# Patient Record
Sex: Male | Born: 1935 | Race: White | Hispanic: No | Smoking: Former smoker
Health system: Southern US, Community
[De-identification: ages and names within clinical notes are randomized; demographics above are authoritative.]

## PROBLEM LIST (undated history)

## (undated) DIAGNOSIS — D62 Acute posthemorrhagic anemia: Secondary | ICD-10-CM

## (undated) DIAGNOSIS — C189 Malignant neoplasm of colon, unspecified: Secondary | ICD-10-CM

## (undated) DIAGNOSIS — J449 Chronic obstructive pulmonary disease, unspecified: Secondary | ICD-10-CM

## (undated) DIAGNOSIS — Z951 Presence of aortocoronary bypass graft: Secondary | ICD-10-CM

## (undated) DIAGNOSIS — K219 Gastro-esophageal reflux disease without esophagitis: Secondary | ICD-10-CM

## (undated) DIAGNOSIS — C679 Malignant neoplasm of bladder, unspecified: Secondary | ICD-10-CM

## (undated) DIAGNOSIS — N289 Disorder of kidney and ureter, unspecified: Secondary | ICD-10-CM

## (undated) DIAGNOSIS — I639 Cerebral infarction, unspecified: Secondary | ICD-10-CM

## (undated) DIAGNOSIS — R0609 Other forms of dyspnea: Secondary | ICD-10-CM

## (undated) DIAGNOSIS — I2510 Atherosclerotic heart disease of native coronary artery without angina pectoris: Secondary | ICD-10-CM

## (undated) DIAGNOSIS — Z95820 Peripheral vascular angioplasty status with implants and grafts: Secondary | ICD-10-CM

## (undated) DIAGNOSIS — E7800 Pure hypercholesterolemia, unspecified: Secondary | ICD-10-CM

## (undated) DIAGNOSIS — N400 Enlarged prostate without lower urinary tract symptoms: Secondary | ICD-10-CM

## (undated) DIAGNOSIS — I10 Essential (primary) hypertension: Secondary | ICD-10-CM

## (undated) DIAGNOSIS — I739 Peripheral vascular disease, unspecified: Secondary | ICD-10-CM

## (undated) DIAGNOSIS — R0600 Dyspnea, unspecified: Secondary | ICD-10-CM

## (undated) DIAGNOSIS — S301XXA Contusion of abdominal wall, initial encounter: Secondary | ICD-10-CM

## (undated) HISTORY — DX: Gastro-esophageal reflux disease without esophagitis: K21.9

## (undated) HISTORY — DX: Peripheral vascular disease, unspecified: I73.9

## (undated) HISTORY — PX: CHOLECYSTECTOMY OPEN: SUR202

## (undated) HISTORY — DX: Chronic obstructive pulmonary disease, unspecified: J44.9

## (undated) HISTORY — DX: Atherosclerotic heart disease of native coronary artery without angina pectoris: I25.10

## (undated) HISTORY — PX: HERNIA REPAIR: SHX51

## (undated) HISTORY — DX: Presence of aortocoronary bypass graft: Z95.1

## (undated) HISTORY — DX: Benign prostatic hyperplasia without lower urinary tract symptoms: N40.0

## (undated) HISTORY — PX: EYE SURGERY: SHX253

## (undated) HISTORY — PX: FEMORAL-FEMORAL BYPASS GRAFT: SHX936

## (undated) HISTORY — PX: COLECTOMY: SHX59

---

## 1999-01-22 DIAGNOSIS — I639 Cerebral infarction, unspecified: Secondary | ICD-10-CM

## 1999-01-22 HISTORY — DX: Cerebral infarction, unspecified: I63.9

## 2003-07-04 ENCOUNTER — Encounter: Admission: RE | Admit: 2003-07-04 | Discharge: 2003-07-04 | Payer: Self-pay | Admitting: Family Medicine

## 2003-07-04 ENCOUNTER — Encounter: Payer: Self-pay | Admitting: Family Medicine

## 2003-07-11 ENCOUNTER — Ambulatory Visit (HOSPITAL_COMMUNITY): Admission: RE | Admit: 2003-07-11 | Discharge: 2003-07-11 | Payer: Self-pay | Admitting: Gastroenterology

## 2004-12-22 DIAGNOSIS — Z951 Presence of aortocoronary bypass graft: Secondary | ICD-10-CM

## 2004-12-22 HISTORY — DX: Presence of aortocoronary bypass graft: Z95.1

## 2009-02-16 ENCOUNTER — Emergency Department (HOSPITAL_COMMUNITY): Admission: EM | Admit: 2009-02-16 | Discharge: 2009-02-16 | Payer: Self-pay | Admitting: Family Medicine

## 2009-08-02 ENCOUNTER — Ambulatory Visit: Payer: Self-pay | Admitting: Family Medicine

## 2009-09-05 ENCOUNTER — Ambulatory Visit: Payer: Self-pay | Admitting: Family Medicine

## 2009-09-07 ENCOUNTER — Ambulatory Visit: Payer: Self-pay | Admitting: Oncology

## 2009-09-26 LAB — CBC WITH DIFFERENTIAL/PLATELET
BASO%: 1.4 % (ref 0.0–2.0)
EOS%: 4.2 % (ref 0.0–7.0)
Eosinophils Absolute: 0.2 10*3/uL (ref 0.0–0.5)
LYMPH%: 29.2 % (ref 14.0–49.0)
MCHC: 34.4 g/dL (ref 32.0–36.0)
MCV: 92.1 fL (ref 79.3–98.0)
MONO%: 9.1 % (ref 0.0–14.0)
NEUT#: 3.2 10*3/uL (ref 1.5–6.5)
Platelets: 165 10*3/uL (ref 140–400)
RBC: 4.33 10*6/uL (ref 4.20–5.82)
RDW: 13 % (ref 11.0–14.6)

## 2009-09-26 LAB — COMPREHENSIVE METABOLIC PANEL
ALT: 10 U/L (ref 0–53)
AST: 16 U/L (ref 0–37)
Albumin: 4.1 g/dL (ref 3.5–5.2)
Alkaline Phosphatase: 97 U/L (ref 39–117)
Potassium: 4.2 mEq/L (ref 3.5–5.3)
Sodium: 140 mEq/L (ref 135–145)
Total Bilirubin: 0.6 mg/dL (ref 0.3–1.2)
Total Protein: 6.8 g/dL (ref 6.0–8.3)

## 2009-10-09 ENCOUNTER — Ambulatory Visit: Payer: Self-pay | Admitting: Family Medicine

## 2009-10-11 ENCOUNTER — Ambulatory Visit: Payer: Self-pay | Admitting: Family Medicine

## 2010-01-07 ENCOUNTER — Ambulatory Visit: Payer: Self-pay | Admitting: Family Medicine

## 2010-03-25 ENCOUNTER — Ambulatory Visit: Payer: Self-pay | Admitting: Oncology

## 2010-06-18 ENCOUNTER — Ambulatory Visit: Payer: Self-pay | Admitting: Family Medicine

## 2010-08-01 ENCOUNTER — Ambulatory Visit: Payer: Self-pay | Admitting: Family Medicine

## 2010-11-26 ENCOUNTER — Ambulatory Visit: Payer: Self-pay | Admitting: Family Medicine

## 2011-01-12 ENCOUNTER — Encounter: Payer: Self-pay | Admitting: Oncology

## 2011-03-11 ENCOUNTER — Ambulatory Visit (INDEPENDENT_AMBULATORY_CARE_PROVIDER_SITE_OTHER): Payer: PRIVATE HEALTH INSURANCE | Admitting: Family Medicine

## 2011-03-11 DIAGNOSIS — Z79899 Other long term (current) drug therapy: Secondary | ICD-10-CM

## 2011-03-11 DIAGNOSIS — J310 Chronic rhinitis: Secondary | ICD-10-CM

## 2011-03-11 DIAGNOSIS — N400 Enlarged prostate without lower urinary tract symptoms: Secondary | ICD-10-CM

## 2011-03-11 DIAGNOSIS — G8929 Other chronic pain: Secondary | ICD-10-CM

## 2011-04-08 LAB — POCT I-STAT, CHEM 8
Glucose, Bld: 87 mg/dL (ref 70–99)
HCT: 43 % (ref 39.0–52.0)
Hemoglobin: 14.6 g/dL (ref 13.0–17.0)
Potassium: 4.6 mEq/L (ref 3.5–5.1)
Sodium: 138 mEq/L (ref 135–145)

## 2011-05-05 ENCOUNTER — Telehealth: Payer: Self-pay | Admitting: Family Medicine

## 2011-05-09 NOTE — Op Note (Signed)
NAME:  Julian Hale, Julian Hale                       ACCOUNT NO.:  [id number]   MEDICAL RECORD NO.:  [id number]                   PATIENT TYPE:  AMB   LOCATION:  ENDO                                 FACILITY:  [facility]   PHYSICIAN:  Graylin Shiver, M.D.                DATE OF BIRTH:  02-Aug-1926   DATE OF PROCEDURE:  07/11/2003  DATE OF DISCHARGE:                                 OPERATIVE REPORT   PROCEDURE:  Esophagogastroduodenoscopy with endoscopic balloon dilatation of  the distal esophageal stricture.   INDICATIONS FOR PROCEDURE:  This patient is a 77 year old male with  dysphagia to solid foods for years. He also had intermittent heartburn. A  barium swallow was done which showed a distal esophageal stricture which  impeded the passage of a 13 mm barium tablet. There was also a moderate size  hiatal hernia. Endoscopy with dilatation is being done due to the patient's  symptoms.   Informed consent was obtained after explanation of the risks of bleeding,  infection, perforation.   PREMEDICATION:  Fentanyl 50 mcg IV, Versed 4 mg IV.   DESCRIPTION OF PROCEDURE:  With the patient in the left lateral decubitus  position, the Olympus gastroscope was inserted into the oropharynx and  passed into the esophagus. It was advanced down the esophagus and then into  the stomach. The distal esophageal stricture was seen at 35 cm. This  appeared to be a peptic stricture. It did not give any resistance to the  passage of the 10 mm scope. The scope was advanced down into the duodenum.  The second portion and bulb of the duodenum were normal. The stomach had  normal appearing mucosa in its entirety. Retroflexion revealed the hiatal  hernia again but no abnormalities other than that were seen. The scope was  then straightened and brought back into the esophagus. A 12 mm endoscopic  balloon dilator was advanced down the scope and appropriately placed at the  level of the stricture. It was dilated to 12  mm and held in place for one  minute. The dilator was then deflated and removed. I then advanced a 14 mm  balloon dilator down the scope at the level of the stricture and dilated to  14 mm holding the balloon in place at the level of the stricture for one  minute. The balloon was then deflated and removed. There was some heme at  the site of the dilation. It appears that the dilation was successful. No  other abnormalities were seen in the esophagus. He tolerated this procedure  well without complications.   IMPRESSION:  1. Distal esophageal stricture at 35 cm which appears to be a peptic     stricture.  2. Hiatal hernia.  3. The stricture was dilated to 14 mm.    PLAN:  A prescription for Prevacid SoluTab will be given. I would recommend  that the patient remain on a proton pump  inhibitor. We will observe the  response to the dilatation with hopes that this will help his symptoms of  dysphagia.                                               Graylin Shiver, M.D.    Germain Osgood  D:  07/11/2003  T:  07/11/2003  Job:  [id number]   cc:   Sharlot Gowda, M.D.  [address] [place], [place] [address]  Fax: [phone]

## 2011-06-09 NOTE — Telephone Encounter (Signed)
Done per [employer]

## 2011-07-28 ENCOUNTER — Other Ambulatory Visit: Payer: Self-pay

## 2011-07-28 MED ORDER — TERAZOSIN HCL 1 MG PO CAPS: 1.0000 mg | ORAL_CAPSULE | Freq: Two times a day (BID) | ORAL | Status: DC

## 2011-07-28 MED ORDER — FLUTICASONE PROPIONATE 50 MCG/ACT NA SUSP: 2.0000 | Freq: Every day | NASAL | Status: DC

## 2011-08-24 ENCOUNTER — Other Ambulatory Visit: Payer: Self-pay | Admitting: Family Medicine

## 2011-09-24 ENCOUNTER — Encounter: Payer: Self-pay | Admitting: Family Medicine

## 2011-09-30 DIAGNOSIS — Z0279 Encounter for issue of other medical certificate: Secondary | ICD-10-CM

## 2011-10-16 ENCOUNTER — Other Ambulatory Visit: Payer: Self-pay | Admitting: Family Medicine

## 2011-10-16 NOTE — Telephone Encounter (Signed)
Is this okay?

## 2011-10-17 ENCOUNTER — Telehealth: Payer: Self-pay | Admitting: Internal Medicine

## 2011-10-17 NOTE — Telephone Encounter (Signed)
Notified pt that his med was renewed through [facility]

## 2011-11-23 ENCOUNTER — Other Ambulatory Visit: Payer: Self-pay | Admitting: Family Medicine

## 2011-11-24 NOTE — Telephone Encounter (Signed)
Is this ok?

## 2011-11-24 NOTE — Telephone Encounter (Signed)
He needs an appointment

## 2011-11-24 NOTE — Telephone Encounter (Signed)
Pt informed he needs appt

## 2011-11-27 ENCOUNTER — Ambulatory Visit (INDEPENDENT_AMBULATORY_CARE_PROVIDER_SITE_OTHER): Payer: PRIVATE HEALTH INSURANCE | Admitting: Family Medicine

## 2011-11-27 ENCOUNTER — Encounter: Payer: Self-pay | Admitting: Family Medicine

## 2011-11-27 VITALS — BP 122/70 | HR 55 | Wt 165.0 lb

## 2011-11-27 DIAGNOSIS — J309 Allergic rhinitis, unspecified: Secondary | ICD-10-CM

## 2011-11-27 DIAGNOSIS — Z79899 Other long term (current) drug therapy: Secondary | ICD-10-CM

## 2011-11-27 DIAGNOSIS — K219 Gastro-esophageal reflux disease without esophagitis: Secondary | ICD-10-CM

## 2011-11-27 DIAGNOSIS — Z85038 Personal history of other malignant neoplasm of large intestine: Secondary | ICD-10-CM | POA: Insufficient documentation

## 2011-11-27 DIAGNOSIS — I2510 Atherosclerotic heart disease of native coronary artery without angina pectoris: Secondary | ICD-10-CM | POA: Insufficient documentation

## 2011-11-27 DIAGNOSIS — N400 Enlarged prostate without lower urinary tract symptoms: Secondary | ICD-10-CM

## 2011-11-27 NOTE — Patient Instructions (Signed)
Stop taking the Terazosin and start the new medication when it comes in

## 2011-11-27 NOTE — Progress Notes (Signed)
  Subjective:    Patient ID: Julian Hale, male    DOB: January 02, 1926, 77 y.o.   MRN: [id number]  HPI He is here for an interval evaluation. He continues to have difficulty with his allergies. The last nasal steroid inhaler really did not do him much good. He has tried Astelin in the past without much benefit. His reflux seems to be under good control. He has not followed up with cardiology or with oncology and states that he is not interested in either of these. He has very little in the way of urinary symptoms. Otherwise she has no particular concerns or complaints.   Review of Systems     Objective:   Physical Exam alert and in no distress. Tympanic membranes and canals are normal. Throat is clear. Tonsils are normal. Neck is supple without adenopathy or thyromegaly. Cardiac exam shows a regular sinus rhythm without murmurs or gallops. Lungs are clear to auscultation.        Assessment & Plan:   1. GERD (gastroesophageal reflux disease)    2. BPH (benign prostatic hyperplasia)    3. Allergic rhinitis, mild    4. ASHD (arteriosclerotic heart disease)  CBC with Differential, Comprehensive metabolic panel, Lipid panel  5. History of colon cancer  CBC with Differential, Comprehensive metabolic panel  6. Encounter for long-term (current) use of other medications  CBC with Differential, Comprehensive metabolic panel, Lipid panel   a sample of Nasonex given. He will let me know how this works for his allergies. Routine blood screening.

## 2011-11-28 LAB — COMPREHENSIVE METABOLIC PANEL
ALT: 12 U/L (ref 0–53)
Alkaline Phosphatase: 80 U/L (ref 39–117)
CO2: 30 mEq/L (ref 19–32)
Creat: 1.18 mg/dL (ref 0.50–1.35)
Glucose, Bld: 88 mg/dL (ref 70–99)
Sodium: 144 mEq/L (ref 135–145)
Total Bilirubin: 0.5 mg/dL (ref 0.3–1.2)
Total Protein: 6.1 g/dL (ref 6.0–8.3)

## 2011-11-28 LAB — CBC WITH DIFFERENTIAL/PLATELET
Basophils Relative: 3 % — ABNORMAL HIGH (ref 0–1)
Eosinophils Absolute: 0.3 10*3/uL (ref 0.0–0.7)
Eosinophils Relative: 7 % — ABNORMAL HIGH (ref 0–5)
Lymphs Abs: 1.4 10*3/uL (ref 0.7–4.0)
MCH: 30.4 pg (ref 26.0–34.0)
MCHC: 31.5 g/dL (ref 30.0–36.0)
MCV: 96.5 fL (ref 78.0–100.0)
Neutrophils Relative %: 52 % (ref 43–77)
Platelets: 144 10*3/uL — ABNORMAL LOW (ref 150–400)

## 2011-11-28 LAB — LIPID PANEL
Cholesterol: 155 mg/dL (ref 0–200)
LDL Cholesterol: 87 mg/dL (ref 0–99)
Total CHOL/HDL Ratio: 3.5 Ratio
Triglycerides: 121 mg/dL (ref <150)
VLDL: 24 mg/dL (ref 0–40)

## 2011-12-25 ENCOUNTER — Telehealth: Payer: Self-pay

## 2011-12-25 MED ORDER — TERAZOSIN HCL 2 MG PO CAPS: 2.0000 mg | ORAL_CAPSULE | Freq: Every day | ORAL | Status: DC

## 2011-12-25 NOTE — Telephone Encounter (Signed)
Left message as per daughter told her med had been ordered

## 2011-12-25 NOTE — Telephone Encounter (Signed)
terazosin has been reordered. He has been on this medication and has been stable

## 2011-12-25 NOTE — Telephone Encounter (Signed)
Pt daughter called said terazosin was discontinued but no other med called in would like for [place] to call her and leave message what to do please advise

## 2011-12-25 NOTE — Telephone Encounter (Signed)
Let her know that it has not been discontinued. I e-mailed [facility] the prescription.

## 2012-02-01 ENCOUNTER — Other Ambulatory Visit: Payer: Self-pay | Admitting: Family Medicine

## 2012-02-03 ENCOUNTER — Other Ambulatory Visit: Payer: Self-pay

## 2012-02-03 MED ORDER — TERAZOSIN HCL 1 MG PO CAPS: 1.0000 mg | ORAL_CAPSULE | Freq: Two times a day (BID) | ORAL | Status: DC

## 2012-02-03 NOTE — Telephone Encounter (Signed)
Had to reorder med to 1mg  to take 1 pill 2 times daily

## 2012-02-19 ENCOUNTER — Other Ambulatory Visit: Payer: Self-pay | Admitting: Family Medicine

## 2012-05-13 ENCOUNTER — Other Ambulatory Visit: Payer: Self-pay | Admitting: Family Medicine

## 2012-05-14 NOTE — Telephone Encounter (Signed)
Is this okay?

## 2012-05-16 NOTE — Telephone Encounter (Signed)
meds renewed

## 2012-08-07 ENCOUNTER — Other Ambulatory Visit: Payer: Self-pay | Admitting: Family Medicine

## 2012-09-02 ENCOUNTER — Ambulatory Visit (INDEPENDENT_AMBULATORY_CARE_PROVIDER_SITE_OTHER): Payer: PRIVATE HEALTH INSURANCE | Admitting: Family Medicine

## 2012-09-02 ENCOUNTER — Encounter: Payer: Self-pay | Admitting: Family Medicine

## 2012-09-02 VITALS — BP 120/70 | HR 56 | Ht 65.5 in | Wt 168.0 lb

## 2012-09-02 DIAGNOSIS — Z85038 Personal history of other malignant neoplasm of large intestine: Secondary | ICD-10-CM

## 2012-09-02 DIAGNOSIS — I739 Peripheral vascular disease, unspecified: Secondary | ICD-10-CM

## 2012-09-02 DIAGNOSIS — N400 Enlarged prostate without lower urinary tract symptoms: Secondary | ICD-10-CM

## 2012-09-02 DIAGNOSIS — K219 Gastro-esophageal reflux disease without esophagitis: Secondary | ICD-10-CM

## 2012-09-02 DIAGNOSIS — Z79899 Other long term (current) drug therapy: Secondary | ICD-10-CM

## 2012-09-02 DIAGNOSIS — I2510 Atherosclerotic heart disease of native coronary artery without angina pectoris: Secondary | ICD-10-CM

## 2012-09-02 LAB — CBC WITH DIFFERENTIAL/PLATELET
Basophils Absolute: 0.1 10*3/uL (ref 0.0–0.1)
Eosinophils Relative: 7 % — ABNORMAL HIGH (ref 0–5)
Lymphocytes Relative: 27 % (ref 12–46)
MCV: 89.9 fL (ref 78.0–100.0)
Neutro Abs: 2.8 10*3/uL (ref 1.7–7.7)
Neutrophils Relative %: 55 % (ref 43–77)
Platelets: 147 10*3/uL — ABNORMAL LOW (ref 150–400)
RBC: 4.25 MIL/uL (ref 4.22–5.81)
RDW: 13 % (ref 11.5–15.5)
WBC: 5.1 10*3/uL (ref 4.0–10.5)

## 2012-09-02 LAB — LIPID PANEL
Total CHOL/HDL Ratio: 3.4 Ratio
VLDL: 35 mg/dL (ref 0–40)

## 2012-09-02 LAB — COMPREHENSIVE METABOLIC PANEL
ALT: 11 U/L (ref 0–53)
AST: 17 U/L (ref 0–37)
Calcium: 9.3 mg/dL (ref 8.4–10.5)
Chloride: 107 mEq/L (ref 96–112)
Creat: 1.18 mg/dL (ref 0.50–1.35)
Sodium: 143 mEq/L (ref 135–145)
Total Protein: 6.2 g/dL (ref 6.0–8.3)

## 2012-09-02 NOTE — Progress Notes (Signed)
  Subjective:    Patient ID: Julian Hale, male    DOB: 09-23-1926, 78 y.o.   MRN: [id number] HPIevaluation was approximately 5 years ago. He had a CABG about 5 years ago in [place]. Review of Systems he is here for followup visit. He does complain of lower 70 weakness. His last evaluation following his femoropopliteal bypass was approximately 5 years ago when the oncologist evaluated. In the last 4 years he has noted increased difficulty with weakness with walking very short distances. He is noted no skin changes. He has had a CABG and has not had followup on that the last several years. He does not complain of chest pain but does have some shortness of breath. He has a history of colon cancer however further discussion with him indicates he does not want any followup testing done on this. Review of the note from the oncologist indicates need for PET/CT. I discussed this with him and again he was adamant about not wanting anything else done. Does have a history of reflux. His other main concern is nocturia x3. He also complains of decreased stream, hesitancy and feeling of incomplete emptying.     Objective:   Physical Exam   alert and in no distress. Cardiac exam shows a slow rhythm with no murmurs or gallops. Lungs are clear to auscultation. Abdominal exam shows no masses. No femoral pulses were appreciated. Exam of his lower extremity shows the skin to be normal with questionable capillary refill. No distal pulses were appreciated.         Assessment & Plan:   1. ASHD (arteriosclerotic heart disease)  PR ELECTROCARDIOGRAM, COMPLETE, CBC with Differential, Comprehensive metabolic panel, Lipid panel, Ambulatory referral to Cardiology  2. BPH (benign prostatic hyperplasia)    3. GERD (gastroesophageal reflux disease)    4. History of colon cancer  CBC with Differential, Comprehensive metabolic panel  5. PVD (peripheral vascular disease) with claudication  Lower Extremity Arterial Doppler  Bilateral, CBC with Differential, Comprehensive metabolic panel, Ambulatory referral to Cardiology  6. Encounter for long-term (current) use of other medications  CBC with Differential, Comprehensive metabolic panel, Lipid panel   again he expressed no interest in following up on his colon cancer. Explained that BPH symptoms although significant did not require immediate evaluation. Stressed the fact that his peripheral vascular as well as cardiac status is more important at this time.

## 2012-09-23 ENCOUNTER — Other Ambulatory Visit (HOSPITAL_COMMUNITY): Payer: Self-pay | Admitting: Cardiovascular Disease

## 2012-09-23 DIAGNOSIS — I270 Primary pulmonary hypertension: Secondary | ICD-10-CM

## 2012-09-29 ENCOUNTER — Telehealth: Payer: Self-pay | Admitting: Family Medicine

## 2012-09-29 NOTE — Telephone Encounter (Signed)
Pt's daughter called and stated that Dr. Allyson Sabal had given then written orders to have blood drawn. Pt's daughter would like to bring him by tomorrow morning. Please inform me if this is ok and I will call her and set it up. PLEASE SEND BACK TO ME- KATHY SWAIM  Thanks

## 2012-09-30 ENCOUNTER — Encounter (HOSPITAL_COMMUNITY): Payer: Self-pay

## 2012-09-30 ENCOUNTER — Other Ambulatory Visit: Payer: PRIVATE HEALTH INSURANCE

## 2012-09-30 ENCOUNTER — Other Ambulatory Visit (HOSPITAL_COMMUNITY): Payer: Self-pay | Admitting: Cardiovascular Disease

## 2012-09-30 ENCOUNTER — Ambulatory Visit (HOSPITAL_COMMUNITY)
Admission: RE | Admit: 2012-09-30 | Discharge: 2012-09-30 | Disposition: A | Discharge status: Home / Self Care | Payer: PRIVATE HEALTH INSURANCE | Source: Ambulatory Visit | Attending: Cardiovascular Disease | Admitting: Cardiovascular Disease

## 2012-09-30 DIAGNOSIS — I70209 Unspecified atherosclerosis of native arteries of extremities, unspecified extremity: Secondary | ICD-10-CM | POA: Insufficient documentation

## 2012-09-30 DIAGNOSIS — I270 Primary pulmonary hypertension: Secondary | ICD-10-CM

## 2012-09-30 DIAGNOSIS — D689 Coagulation defect, unspecified: Secondary | ICD-10-CM

## 2012-09-30 DIAGNOSIS — R5381 Other malaise: Secondary | ICD-10-CM

## 2012-09-30 DIAGNOSIS — Z79899 Other long term (current) drug therapy: Secondary | ICD-10-CM

## 2012-09-30 DIAGNOSIS — I745 Embolism and thrombosis of iliac artery: Secondary | ICD-10-CM | POA: Insufficient documentation

## 2012-09-30 MED ORDER — IOHEXOL 350 MG/ML SOLN
100.0000 mL | Freq: Once | INTRAVENOUS | Status: AC | PRN
  Administered 2012-09-30: 100 mL via INTRAVENOUS

## 2012-09-30 NOTE — Telephone Encounter (Signed)
Julian Hale he said this was ok and pt has already been here

## 2012-10-01 ENCOUNTER — Other Ambulatory Visit: Payer: PRIVATE HEALTH INSURANCE

## 2012-10-01 LAB — PROTIME-INR
INR: 1.11 (ref <1.50)
Prothrombin Time: 14.3 seconds (ref 11.6–15.2)

## 2012-10-01 LAB — BASIC METABOLIC PANEL
CO2: 29 mEq/L (ref 19–32)
Calcium: 9.1 mg/dL (ref 8.4–10.5)
Creat: 1.09 mg/dL (ref 0.50–1.35)
Glucose, Bld: 87 mg/dL (ref 70–99)
Sodium: 141 mEq/L (ref 135–145)

## 2012-10-01 LAB — CBC
HCT: 38.1 % — ABNORMAL LOW (ref 39.0–52.0)
MCHC: 33.6 g/dL (ref 30.0–36.0)
Platelets: 113 10*3/uL — ABNORMAL LOW (ref 150–400)
RDW: 13.7 % (ref 11.5–15.5)
WBC: 4.3 10*3/uL (ref 4.0–10.5)

## 2012-10-01 LAB — APTT: aPTT: 34 seconds (ref 24–37)

## 2012-10-15 HISTORY — PX: CARDIOVASCULAR STRESS TEST: SHX262

## 2012-10-30 ENCOUNTER — Other Ambulatory Visit: Payer: Self-pay | Admitting: Family Medicine

## 2012-11-01 ENCOUNTER — Encounter (HOSPITAL_COMMUNITY): Payer: Self-pay | Admitting: Pharmacy Technician

## 2012-11-02 ENCOUNTER — Other Ambulatory Visit: Payer: Self-pay | Admitting: Cardiovascular Disease

## 2012-11-16 ENCOUNTER — Encounter (HOSPITAL_COMMUNITY)
Admission: RE | Disposition: A | Discharge status: Home / Self Care | Payer: Self-pay | Source: Ambulatory Visit | Attending: Cardiovascular Disease

## 2012-11-16 ENCOUNTER — Encounter (HOSPITAL_COMMUNITY): Payer: Self-pay | Admitting: Cardiology

## 2012-11-16 ENCOUNTER — Ambulatory Visit (HOSPITAL_COMMUNITY)
Admission: RE | Admit: 2012-11-16 | Discharge: 2012-11-18 | Disposition: A | Discharge status: Home / Self Care | Payer: Medicare Other | Source: Ambulatory Visit | Attending: Cardiovascular Disease | Admitting: Cardiovascular Disease

## 2012-11-16 DIAGNOSIS — E785 Hyperlipidemia, unspecified: Secondary | ICD-10-CM | POA: Diagnosis present

## 2012-11-16 DIAGNOSIS — D62 Acute posthemorrhagic anemia: Secondary | ICD-10-CM | POA: Diagnosis not present

## 2012-11-16 DIAGNOSIS — Z951 Presence of aortocoronary bypass graft: Secondary | ICD-10-CM | POA: Insufficient documentation

## 2012-11-16 DIAGNOSIS — Z79899 Other long term (current) drug therapy: Secondary | ICD-10-CM | POA: Insufficient documentation

## 2012-11-16 DIAGNOSIS — I2510 Atherosclerotic heart disease of native coronary artery without angina pectoris: Secondary | ICD-10-CM | POA: Diagnosis present

## 2012-11-16 DIAGNOSIS — Z95820 Peripheral vascular angioplasty status with implants and grafts: Secondary | ICD-10-CM

## 2012-11-16 DIAGNOSIS — N400 Enlarged prostate without lower urinary tract symptoms: Secondary | ICD-10-CM | POA: Insufficient documentation

## 2012-11-16 DIAGNOSIS — Z23 Encounter for immunization: Secondary | ICD-10-CM | POA: Insufficient documentation

## 2012-11-16 DIAGNOSIS — Z85038 Personal history of other malignant neoplasm of large intestine: Secondary | ICD-10-CM | POA: Insufficient documentation

## 2012-11-16 DIAGNOSIS — K219 Gastro-esophageal reflux disease without esophagitis: Secondary | ICD-10-CM | POA: Insufficient documentation

## 2012-11-16 DIAGNOSIS — J4489 Other specified chronic obstructive pulmonary disease: Secondary | ICD-10-CM | POA: Insufficient documentation

## 2012-11-16 DIAGNOSIS — I708 Atherosclerosis of other arteries: Secondary | ICD-10-CM | POA: Insufficient documentation

## 2012-11-16 DIAGNOSIS — S301XXA Contusion of abdominal wall, initial encounter: Secondary | ICD-10-CM | POA: Diagnosis not present

## 2012-11-16 DIAGNOSIS — J449 Chronic obstructive pulmonary disease, unspecified: Secondary | ICD-10-CM | POA: Diagnosis present

## 2012-11-16 DIAGNOSIS — I739 Peripheral vascular disease, unspecified: Secondary | ICD-10-CM | POA: Diagnosis present

## 2012-11-16 HISTORY — DX: Other forms of dyspnea: R06.09

## 2012-11-16 HISTORY — DX: Peripheral vascular angioplasty status with implants and grafts: Z95.820

## 2012-11-16 HISTORY — DX: Essential (primary) hypertension: I10

## 2012-11-16 HISTORY — DX: Dyspnea, unspecified: R06.00

## 2012-11-16 HISTORY — DX: Acute posthemorrhagic anemia: D62

## 2012-11-16 HISTORY — PX: ILIAC ARTERY STENT: SHX1786

## 2012-11-16 HISTORY — DX: Cerebral infarction, unspecified: I63.9

## 2012-11-16 HISTORY — DX: Contusion of abdominal wall, initial encounter: S30.1XXA

## 2012-11-16 HISTORY — DX: Pure hypercholesterolemia, unspecified: E78.00

## 2012-11-16 LAB — CBC WITH DIFFERENTIAL/PLATELET
Basophils Absolute: 0.1 10*3/uL (ref 0.0–0.1)
Eosinophils Relative: 6 % — ABNORMAL HIGH (ref 0–5)
HCT: 38.8 % — ABNORMAL LOW (ref 39.0–52.0)
Lymphocytes Relative: 30 % (ref 12–46)
Lymphs Abs: 1.5 10*3/uL (ref 0.7–4.0)
MCH: 31.6 pg (ref 26.0–34.0)
MCV: 92.2 fL (ref 78.0–100.0)
Monocytes Absolute: 0.3 10*3/uL (ref 0.1–1.0)
RDW: 12.9 % (ref 11.5–15.5)
WBC: 5.1 10*3/uL (ref 4.0–10.5)

## 2012-11-16 LAB — POCT ACTIVATED CLOTTING TIME: Activated Clotting Time: 174 seconds

## 2012-11-16 LAB — BASIC METABOLIC PANEL
CO2: 26 mEq/L (ref 19–32)
Calcium: 9 mg/dL (ref 8.4–10.5)
Creatinine, Ser: 1.09 mg/dL (ref 0.50–1.35)
Glucose, Bld: 105 mg/dL — ABNORMAL HIGH (ref 70–99)

## 2012-11-16 SURGERY — ABDOMINAL AORTAGRAM

## 2012-11-16 MED ORDER — METOPROLOL TARTRATE 50 MG PO TABS
50.0000 mg | ORAL_TABLET | Freq: Two times a day (BID) | ORAL | Status: DC
  Administered 2012-11-16 – 2012-11-18 (×4): 50 mg via ORAL
  Filled 2012-11-16 (×5): qty 1

## 2012-11-16 MED ORDER — FINASTERIDE 5 MG PO TABS
5.0000 mg | ORAL_TABLET | Freq: Every day | ORAL | Status: DC
  Administered 2012-11-17 – 2012-11-18 (×2): 5 mg via ORAL
  Filled 2012-11-16 (×2): qty 1

## 2012-11-16 MED ORDER — SODIUM CHLORIDE 0.9 % IV SOLN
INTRAVENOUS | Status: AC
  Administered 2012-11-16: 12:00:00 via INTRAVENOUS

## 2012-11-16 MED ORDER — ONDANSETRON HCL 4 MG/2ML IJ SOLN
4.0000 mg | Freq: Four times a day (QID) | INTRAMUSCULAR | Status: DC | PRN
  Administered 2012-11-16: 15:00:00 4 mg via INTRAVENOUS
  Filled 2012-11-16: qty 2

## 2012-11-16 MED ORDER — ASPIRIN EC 325 MG PO TBEC
325.0000 mg | DELAYED_RELEASE_TABLET | Freq: Every day | ORAL | Status: DC
  Administered 2012-11-17 – 2012-11-18 (×2): 325 mg via ORAL
  Filled 2012-11-16 (×2): qty 1

## 2012-11-16 MED ORDER — FENTANYL CITRATE 0.05 MG/ML IJ SOLN
INTRAMUSCULAR | Status: AC
  Filled 2012-11-16: qty 2

## 2012-11-16 MED ORDER — INFLUENZA VIRUS VACC SPLIT PF IM SUSP
0.5000 mL | INTRAMUSCULAR | Status: AC
  Administered 2012-11-17: 0.5 mL via INTRAMUSCULAR
  Filled 2012-11-16: qty 0.5

## 2012-11-16 MED ORDER — ASPIRIN 81 MG PO TABS
81.0000 mg | ORAL_TABLET | Freq: Every day | ORAL | Status: DC
Start: 2012-11-16 — End: 2012-11-16

## 2012-11-16 MED ORDER — ONDANSETRON HCL 4 MG/2ML IJ SOLN
4.0000 mg | INTRAMUSCULAR | Status: DC | PRN
  Administered 2012-11-16: 21:00:00 4 mg via INTRAVENOUS
  Filled 2012-11-16: qty 2

## 2012-11-16 MED ORDER — DIAZEPAM 5 MG PO TABS
5.0000 mg | ORAL_TABLET | ORAL | Status: AC
  Administered 2012-11-16: 5 mg via ORAL

## 2012-11-16 MED ORDER — DIAZEPAM 5 MG PO TABS
ORAL_TABLET | ORAL | Status: AC
  Filled 2012-11-16: qty 1

## 2012-11-16 MED ORDER — ACETAMINOPHEN 325 MG PO TABS: 650.0000 mg | ORAL_TABLET | ORAL | Status: DC | PRN

## 2012-11-16 MED ORDER — PANTOPRAZOLE SODIUM 40 MG PO TBEC
40.0000 mg | DELAYED_RELEASE_TABLET | Freq: Every day | ORAL | Status: DC
  Administered 2012-11-17: 40 mg via ORAL
  Filled 2012-11-16: qty 1

## 2012-11-16 MED ORDER — LIDOCAINE HCL (PF) 1 % IJ SOLN
INTRAMUSCULAR | Status: AC
  Filled 2012-11-16: qty 30

## 2012-11-16 MED ORDER — ISOSORBIDE MONONITRATE ER 30 MG PO TB24
30.0000 mg | ORAL_TABLET | Freq: Every day | ORAL | Status: DC
  Administered 2012-11-17 – 2012-11-18 (×2): 30 mg via ORAL
  Filled 2012-11-16 (×2): qty 1

## 2012-11-16 MED ORDER — CLOPIDOGREL BISULFATE 300 MG PO TABS
ORAL_TABLET | ORAL | Status: AC
  Filled 2012-11-16: qty 1

## 2012-11-16 MED ORDER — SODIUM CHLORIDE 0.9 % IV SOLN
INTRAVENOUS | Status: DC
  Administered 2012-11-16: 09:00:00 via INTRAVENOUS

## 2012-11-16 MED ORDER — CLOPIDOGREL BISULFATE 75 MG PO TABS
75.0000 mg | ORAL_TABLET | Freq: Every day | ORAL | Status: DC
  Administered 2012-11-17 – 2012-11-18 (×2): 75 mg via ORAL
  Filled 2012-11-16 (×3): qty 1

## 2012-11-16 MED ORDER — HEPARIN (PORCINE) IN NACL 2-0.9 UNIT/ML-% IJ SOLN
INTRAMUSCULAR | Status: AC
  Filled 2012-11-16: qty 1000

## 2012-11-16 MED ORDER — TERAZOSIN HCL 1 MG PO CAPS
1.0000 mg | ORAL_CAPSULE | Freq: Two times a day (BID) | ORAL | Status: DC
  Administered 2012-11-16 – 2012-11-18 (×4): 1 mg via ORAL
  Filled 2012-11-16 (×5): qty 1

## 2012-11-16 MED ORDER — MORPHINE SULFATE 2 MG/ML IJ SOLN: 2.0000 mg | INTRAMUSCULAR | Status: DC | PRN

## 2012-11-16 MED ORDER — ATORVASTATIN CALCIUM 40 MG PO TABS
40.0000 mg | ORAL_TABLET | Freq: Every day | ORAL | Status: DC
  Administered 2012-11-17: 40 mg via ORAL
  Filled 2012-11-16 (×2): qty 1

## 2012-11-16 MED ORDER — SODIUM CHLORIDE 0.9 % IJ SOLN: 3.0000 mL | INTRAMUSCULAR | Status: DC | PRN

## 2012-11-16 MED ORDER — PNEUMOCOCCAL VAC POLYVALENT 25 MCG/0.5ML IJ INJ
0.5000 mL | INJECTION | INTRAMUSCULAR | Status: AC
  Filled 2012-11-16 (×2): qty 0.5

## 2012-11-16 MED ORDER — HEPARIN SODIUM (PORCINE) 1000 UNIT/ML IJ SOLN
INTRAMUSCULAR | Status: AC
  Filled 2012-11-16: qty 1

## 2012-11-16 NOTE — H&P (Signed)
  H & P will be scanned in.  Pt was reexamined and existing H & P reviewed. No changes found.  Runell Gess, MD [facility] 11/16/2012 10:37 AM

## 2012-11-16 NOTE — Progress Notes (Addendum)
Site area: left groin  Site Prior to Removal:  Level 0  Pressure Applied For 70 MINUTES    Minutes Beginning at 1320  Manual:   yes  Patient Status During Pull:  stable  Post Pull Groin Site:  Level 2  Post Pull Instructions Given:  yes  Post Pull Pulses Present:  yes  Dressing Applied:  yes  Comments:  Complicated hold unable to maintain hemostasis and hematoma developed to left groin site. Total of 1 hour 10 min held for hemostasis to site. Palpable left and right DP present. Vitals remained stable. Dr. Allyson Sabal updated with status.

## 2012-11-16 NOTE — Progress Notes (Signed)
Called to [phone] , groin management. Held pressure for 30 minutes total. Area of hematoma present 9cm by 15 cm lateral to left femoral artery access site. No further progression of hematoma. Hematoma area marked with skin marker. Tegaderm dressing applied. Distal pulse left dp palpable.

## 2012-11-16 NOTE — H&P (Signed)
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 He was referred to Dr Allyson Sabal by Dr Susann Givens for claudication. The pt has a history of L-R FFBPG in [name] 64yrs ago. He also had CABG in 2006 in [name] (no details available). OP CTA revealed LCIA stenosis that is compromising the FFBPG. He is admitted now for PVA. He did have a Myoview in Oct that was low risk. Hiss EF at [facility] was > 55%. hE HAS NOT HAD ANGINA.   Problem List: Past Medical History  Diagnosis Date  . ASHD (arteriosclerotic heart disease)   . COPD (chronic obstructive pulmonary disease)   . GERD (gastroesophageal reflux disease)   . Arthritis   . BPH (benign prostatic hyperplasia)     Past Surgical History  Procedure Date  . Cholecystectomy open   . Partial colectomy      Allergies: No Known Allergies   Home Medications Prescriptions prior to admission  Medication Sig Dispense Refill  . aspirin 81 MG tablet Take 81 mg by mouth daily.      . finasteride (PROSCAR) 5 MG tablet Take 5 mg by mouth daily.      . isosorbide mononitrate (IMDUR) 30 MG 24 hr tablet Take 30 mg by mouth daily.      . metoprolol (LOPRESSOR) 50 MG tablet Take 50 mg by mouth 2 (two) times daily.      Marland Kitchen omeprazole (PRILOSEC) 20 MG capsule Take 20 mg by mouth daily.      . simvastatin (ZOCOR) 80 MG tablet Take 40 mg by mouth at bedtime.      Marland Kitchen terazosin (HYTRIN) 1 MG capsule Take 1 mg by mouth 2 (two) times daily.         No family history on file.   History   Social History  . Marital Status: Widowed    Spouse Name: N/A    Number of Children: N/A  . Years of Education: N/A   Occupational History  . Not on file.   Social History Main Topics  . Smoking status: Former Games developer  . Smokeless tobacco: Never Used  . Alcohol Use: Not  on file  . Drug Use: Not on file  . Sexually Active: Not on file   Other Topics Concern  . 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     Review of Systems: General: negative for chills, fever, night sweats or weight changes.  Cardiovascular: negative for chest pain, dyspnea on exertion, edema, orthopnea, palpitations, paroxysmal nocturnal dyspnea or shortness of breath Dermatological: negative for rash Respiratory: negative for cough or wheezing Urologic: negative for hematuria Abdominal: negative for nausea, vomiting, diarrhea, bright red blood per rectum, melena, or hematemesis Neurologic: negative for visual changes, syncope, or dizziness All other systems reviewed and are otherwise negative except as noted above.  Physical Exam: Blood pressure 159/66, pulse 61, resp. rate 20, height 5\' 7"  (1.702 m), weight 79.833 kg (176 lb), SpO2 96.00%.  General appearance: alert, cooperative and no distress Neck: no carotid bruit and no JVD Lungs: decreased brea5th sounds and increased AP diameter conssitant with COPD. Heart: regular rate and rhythm Abdomen: soft, non-tender; bowel sounds normal; no masses,  no organomegaly and RUQ  surgical scar Extremities: no edema, dcreased pulses bilat Pulses: diminnished Skin: Skin color, texture, turgor normal. No rashes or lesions Neurologic: Grossly normal    Labs:   Results for orders placed during the hospital encounter of 11/16/12 (from the past 24 hour(s))  PROTIME-INR     Status: Normal   Collection Time   11/16/12  9:15 AM      Component Value Range   Prothrombin Time 13.8  11.6 - 15.2 seconds   INR 1.07  0.00 - 1.49  CBC WITH DIFFERENTIAL     Status: Abnormal   Collection Time   11/16/12  9:15 AM      Component Value Range   WBC 5.1  4.0 - 10.5 K/uL   RBC 4.21 (*) 4.22 - 5.81 MIL/uL   Hemoglobin 13.3  13.0 - 17.0 g/dL   HCT 40.9 (*) 81.1 - 91.4  %   MCV 92.2  78.0 - 100.0 fL   MCH 31.6  26.0 - 34.0 pg   MCHC 34.3  30.0 - 36.0 g/dL   RDW 78.2  95.6 - 21.3 %   Platelets 121 (*) 150 - 400 K/uL   Neutrophils Relative 56  43 - 77 %   Neutro Abs 2.8  1.7 - 7.7 K/uL   Lymphocytes Relative 30  12 - 46 %   Lymphs Abs 1.5  0.7 - 4.0 K/uL   Monocytes Relative 7  3 - 12 %   Monocytes Absolute 0.3  0.1 - 1.0 K/uL   Eosinophils Relative 6 (*) 0 - 5 %   Eosinophils Absolute 0.3  0.0 - 0.7 K/uL   Basophils Relative 2 (*) 0 - 1 %   Basophils Absolute 0.1  0.0 - 0.1 K/uL  BASIC METABOLIC PANEL     Status: Abnormal   Collection Time   11/16/12  9:15 AM      Component Value Range   Sodium 141  135 - 145 mEq/L   Potassium 4.0  3.5 - 5.1 mEq/L   Chloride 105  96 - 112 mEq/L   CO2 26  19 - 32 mEq/L   Glucose, Bld 105 (*) 70 - 99 mg/dL   BUN 17  6 - 23 mg/dL   Creatinine, Ser 0.86  0.50 - 1.35 mg/dL   Calcium 9.0  8.4 - 57.8 mg/dL   GFR calc non Af Amer 60 (*) >90 mL/min   GFR calc Af Amer 70 (*) >90 mL/min     Radiology/Studies: No results found.  ION:GEXB  ASSESSMENT AND PLAN:  Principal Problem:  *Claudication Active Problems:  PVD, Hx of L-R FFBPG 69yrs ago, now with LCIA disease compromising graft  CAD, CABG in [name] 2006. OP Myoview low risk 10/15/12  GERD (gastroesophageal reflux disease)  BPH (benign prostatic hyperplasia)  History of colon cancer  Dyslipidemia  COPD on exam (remote smoker)  Plan- PVA today. He will need repeat labs. Will check CXR this admission.  Deland Pretty, PA-C 11/16/2012, 10:10 AM

## 2012-11-16 NOTE — Op Note (Signed)
Julian Hale is a 78 y.o. male    [id number] LOCATION:  FACILITY: [facility]  PHYSICIAN: Nanetta Batty, M.D. 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 He was referred to Dr Allyson Sabal by Dr Susann Givens for claudication. The pt has a history of L-R FFBPG in [name] 78yrs ago. He also had CABG in 2006 in [name] (no details available). OP CTA revealed LCIA stenosis that is compromising the FFBPG. He is admitted now for PVA. He did have a Myoview in Oct that was low risk. Hiss EF at [facility] was > 55%. hE HAS NOT HAD ANGINA.      PROCEDURE DESCRIPTION:    The patient was brought to the second floor  [facility] cath lab in the postabsorptive state. He was premedicated with Valium 5 mg by mouth and IV Versed and fentanyl.Marland Kitchen His left groinwas prepped and shaved in usual sterile fashion. Xylocaine 1% was used for local anesthesia. A 5 upgraded to a 6 [place] sheath was inserted into the left common femoral artery using standard Seldinger technique. The patient received 8000 units  of heparin  intravenously.  A 5 French pigtail catheter catheter was used for abdominal aortography with bifemoral runoff using bolus chase digital subtraction step table technique. Visipaque dye was used for the entirety of the case. Retrograde aortic pressure was monitored during the case. A total of 150 cc of contrast was administered the patient    HEMODYNAMICS:    AO SYSTOLIC/AO DIASTOLIC: 162/50    ANGIOGRAPHIC RESULTS:   1: Abdominal aortogram-the renal arteries are widely patent. The infrarenal Doppler and mild atherosclerotic changes.  2: Left lower extremity-there was a 95% focal stenosis in the left external iliac artery just above the anastomosis of the left to right fem-fem bypass bypass graft. There was a  50% segmental mid left SFA with two-vessel runoff an occluded posterior tibial vessel.  3: Right lower extremity-the right common and external iliac artery were occluded. The fem-fem crossover graft was patent and supplied blood flow to the entire right leg. As a 50% segmental mid right SFA and the infrapopliteal vessels are not well visualized.  IMPRESSION:Mr. Gibbins has a high-grade left external iliac artery stenosis jeopardizing his fem-fem crossover graft and circulation to both extremities. Proceed with PTA and stenting using a Nitinol self-expanding stent.  Procedure description: using an 035 Wholey wire the lesion was predilated with a 4 x 2 balloon. Following this a 9 mm x 3 cm long Cordis Smart Nitinol self-expanding stent was carefully positioned angiographically and deployed. It was post dilated with a 7 x 2 balloon at times appears resulting in reduction of a 95% focal left external iliac artery stenosis to 0% residual. The patient tolerated the procedure well.  Final impression: Successful PTA and stenting of a high-grade left external iliac artery stenosis jeopardizing circulation in both lower extremities via the left to right fem-fem crossover graft. The patient had excellent result with implantation of a Nitinol self expanding stent. He'll be treated with aspirin Plavix. The sheath will be removed once the ACT falls below 170 and pressure will be held on the groin to achieve hemostasis. The patient will be hydrated overnight and discharged home in the morning. We'll get followup Dopplers in my office and we'll see him  back after  that.  Runell Gess MD, [facility] 11/16/2012 11:35 AM

## 2012-11-17 ENCOUNTER — Encounter (HOSPITAL_COMMUNITY): Payer: Self-pay | Admitting: Cardiology

## 2012-11-17 ENCOUNTER — Other Ambulatory Visit (HOSPITAL_COMMUNITY): Payer: Self-pay | Admitting: Cardiovascular Disease

## 2012-11-17 ENCOUNTER — Encounter (HOSPITAL_COMMUNITY): Payer: Self-pay

## 2012-11-17 DIAGNOSIS — I739 Peripheral vascular disease, unspecified: Secondary | ICD-10-CM

## 2012-11-17 DIAGNOSIS — Z95820 Peripheral vascular angioplasty status with implants and grafts: Secondary | ICD-10-CM

## 2012-11-17 DIAGNOSIS — D62 Acute posthemorrhagic anemia: Secondary | ICD-10-CM

## 2012-11-17 DIAGNOSIS — M79609 Pain in unspecified limb: Secondary | ICD-10-CM

## 2012-11-17 DIAGNOSIS — S301XXA Contusion of abdominal wall, initial encounter: Secondary | ICD-10-CM

## 2012-11-17 HISTORY — DX: Contusion of abdominal wall, initial encounter: S30.1XXA

## 2012-11-17 HISTORY — DX: Peripheral vascular angioplasty status with implants and grafts: Z95.820

## 2012-11-17 HISTORY — DX: Acute posthemorrhagic anemia: D62

## 2012-11-17 LAB — CBC
HCT: 30.5 % — ABNORMAL LOW (ref 39.0–52.0)
HCT: 29 % — ABNORMAL LOW (ref 39.0–52.0)
HCT: 26.5 % — ABNORMAL LOW (ref 39.0–52.0)
Hemoglobin: 10.2 g/dL — ABNORMAL LOW (ref 13.0–17.0)
Hemoglobin: 9.8 g/dL — ABNORMAL LOW (ref 13.0–17.0)
MCH: 31.1 pg (ref 26.0–34.0)
MCH: 31.4 pg (ref 26.0–34.0)
MCH: 31.4 pg (ref 26.0–34.0)
MCHC: 33.8 g/dL (ref 30.0–36.0)
MCHC: 33.8 g/dL (ref 30.0–36.0)
MCHC: 34.3 g/dL (ref 30.0–36.0)
MCV: 93 fL (ref 78.0–100.0)
MCV: 92.9 fL (ref 78.0–100.0)
MCV: 93.1 fL (ref 78.0–100.0)
Platelets: 107 10*3/uL — ABNORMAL LOW (ref 150–400)
Platelets: 94 10*3/uL — ABNORMAL LOW (ref 150–400)
RBC: 3.28 MIL/uL — ABNORMAL LOW (ref 4.22–5.81)
RBC: 3.18 MIL/uL — ABNORMAL LOW (ref 4.22–5.81)
RDW: 13.2 % (ref 11.5–15.5)
RDW: 13 % (ref 11.5–15.5)
RDW: 13 % (ref 11.5–15.5)
WBC: 6.9 10*3/uL (ref 4.0–10.5)
WBC: 6.3 10*3/uL (ref 4.0–10.5)

## 2012-11-17 LAB — BASIC METABOLIC PANEL
CO2: 24 mEq/L (ref 19–32)
Calcium: 8.7 mg/dL (ref 8.4–10.5)
Chloride: 105 mEq/L (ref 96–112)
Glucose, Bld: 121 mg/dL — ABNORMAL HIGH (ref 70–99)
Sodium: 139 mEq/L (ref 135–145)

## 2012-11-17 MED ORDER — PANTOPRAZOLE SODIUM 40 MG PO TBEC: 40.0000 mg | DELAYED_RELEASE_TABLET | Freq: Every day | ORAL | Status: DC

## 2012-11-17 MED ORDER — CLOPIDOGREL BISULFATE 75 MG PO TABS: 75.0000 mg | ORAL_TABLET | Freq: Every day | ORAL | Status: DC

## 2012-11-17 NOTE — Discharge Summary (Signed)
Physician Discharge Summary  Patient ID: Julian Hale MRN: [id number] DOB/AGE: Feb 27, 1926 78 y.o.  Admit date: 11/16/2012 Discharge date: 11/18/2012   Discharge Diagnoses:  Principal Problem:  *Claudication Active Problems:  GERD (gastroesophageal reflux disease)  BPH (benign prostatic hyperplasia)  History of colon cancer  PVD, Hx of L-R FFBPG 41yrs ago, now with LCIA disease compromising graft  CAD, CABG in [name] 2006. 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 He was referred to Dr Allyson Sabal by Dr Susann Givens for claudication. The pt has a history of L-R FFBPG in [name] 38yrs ago. He also had CABG in 2006 in [name] (no details available). OP CTA revealed LCIA stenosis that is compromising the FFBPG. He is admitted now for PVA. He did have a Myoview in Oct that was low risk. Hiss EF at [facility] was > 55%. hE HAS NOT HAD ANGINA.    Pt underwent PV angio and successful PTA and stenting of a high-grade left external iliac artery stenosis jeopardizing circulation in both lower extremities via the left to right fem-fem crossover graft.  Pt tolerated the procedure well, but developed a hematoma with sheath pull.  Artery was stabilized.  By the next AM Hgb had dropped 3 grams.  Pt was asymptomatic.  Arterial doppler did not reveal dissection or pseudoaneurysm.    Pt kept for 24 hours longer to eval bleed with drop in Hgb in 78 year old male.   Serial H/H completed.    If pt stable by 11/18/12 he will be discharged home.   Consults: None  Significant Diagnostic Studies:  CBC    Component Value Date/Time   WBC 6.0 11/17/2012 1111   WBC 5.7 09/26/2009 1338   RBC 3.12* 11/17/2012  1111   RBC 4.33 09/26/2009 1338   HGB 9.8* 11/17/2012 1111   HGB 13.7 09/26/2009 1338   HCT 29.0* 11/17/2012 1111   HCT 39.8 09/26/2009 1338   PLT 99* 11/17/2012 1111   PLT 165 09/26/2009 1338   MCV 92.9 11/17/2012 1111   MCV 92.1 09/26/2009 1338   MCH 31.4 11/17/2012 1111   MCH 31.7 09/26/2009 1338   MCHC 33.8 11/17/2012 1111   MCHC 34.4 09/26/2009 1338   RDW 13.2 11/17/2012 1111   RDW 13.0 09/26/2009 1338   LYMPHSABS 1.5 11/16/2012 0915   LYMPHSABS 1.7 09/26/2009 1338   MONOABS 0.3 11/16/2012 0915   MONOABS 0.5 09/26/2009 1338   EOSABS 0.3 11/16/2012 0915   EOSABS 0.2 09/26/2009 1338   BASOSABS 0.1 11/16/2012 0915   BASOSABS 0.1 09/26/2009 1338    BMET    Component Value Date/Time   NA 139 11/17/2012 0450   K 4.3 11/17/2012 0450   CL 105 11/17/2012 0450   CO2 24 11/17/2012 0450   GLUCOSE 121* 11/17/2012 0450   BUN 22 11/17/2012 0450   CREATININE 1.34 11/17/2012 0450   CREATININE 1.09 09/30/2012 1008   CALCIUM 8.7 11/17/2012 0450   GFRNONAA 47* 11/17/2012 0450   GFRAA 54* 11/17/2012 0450       Discharge Exam: Blood pressure 150/70, pulse 73, temperature 97.8 F (36.6 C), temperature source Oral, resp. rate 22, height 5\' 7"  (1.702 m), weight  80.2 kg (176 lb 12.9 oz), SpO2 97.00%.    Disposition: Home  Discharge Orders    Future Appointments: Provider: Department: Dept Phone: Center:   12/09/2012 12:30 PM [facility] [place] [facility] [address] [phone] None       Medication List     As of 11/17/2012  4:50 PM    STOP taking these medications         omeprazole 20 MG capsule   Commonly known as: PRILOSEC   Replaced by: pantoprazole 40 MG tablet      TAKE these medications         aspirin 81 MG tablet   Take 81 mg by mouth daily.      clopidogrel 75 MG tablet   Commonly known as: PLAVIX   Take 1 tablet (75 mg total) by mouth daily with breakfast.      finasteride 5 MG tablet   Commonly known as: PROSCAR   Take 5 mg by  mouth daily.      isosorbide mononitrate 30 MG 24 hr tablet   Commonly known as: IMDUR   Take 30 mg by mouth daily.      metoprolol 50 MG tablet   Commonly known as: LOPRESSOR   Take 50 mg by mouth 2 (two) times daily.      pantoprazole 40 MG tablet   Commonly known as: PROTONIX   Take 1 tablet (40 mg total) by mouth daily.      simvastatin 80 MG tablet   Commonly known as: ZOCOR   Take 40 mg by mouth at bedtime.      terazosin 1 MG capsule   Commonly known as: HYTRIN   Take 1 mg by mouth 2 (two) times daily.           Follow-up Information    Follow up with Runell Gess, MD. On 12/09/2012. (at 12:30 pm for lower ext dopplers)    Contact information:   [address] [place] [address] [phone]       Follow up with Runell Gess, MD. On 12/13/2012. (at 11:30 am)    Contact information:   [address] [place] [address] [phone]        Discharge Instructions: Call if further problems with Lt. Groin. If increased swelling or pain.  Have blood work done on Monday to check blood count  Heart Healthy diet   Signed: INGOLD,LAURA R 11/17/2012, 4:50 PM  BP & HR stable. Renal function & H/H stable.  Groin looks stable - bruit is due to graft.  Ok for d/c on current Rx.  Marykay Lex, M.D., M.S. [facility] [address] [place], [place]  [address]  [phone] Pager # [phone] 11/22/2012 9:04 AM

## 2012-11-17 NOTE — Plan of Care (Signed)
Problem: Phase I Progression Outcomes Goal: Vascular site scale level 0 - I Vascular Site Scale Level 0: No bruising/bleeding/hematoma Level I (Mild): Bruising/Ecchymosis, minimal bleeding/ooozing, palpable hematoma < 3 cm Level II (Moderate): Bleeding not affecting hemodynamic parameters, pseudoaneurysm, palpable hematoma > 3 cm  Outcome: Progressing Level II

## 2012-11-17 NOTE — Progress Notes (Signed)
Left femoral artery evaluation completed.  No evidence of pseudoaneurysm or A-V fistula noted.

## 2012-11-17 NOTE — Progress Notes (Signed)
Subjective: hematoma at cath site at night.   Objective: Vital signs in last 24 hours: Temp:  [97.5 F (36.4 C)-98.3 F (36.8 C)] 98 F (36.7 C) (11/27 0909) Pulse Rate:  [50-82] 82  (11/27 0909) Resp:  [15-26] 24  (11/27 0909) BP: (101-154)/(41-93) 125/42 mmHg (11/27 0909) SpO2:  [91 %-96 %] 91 % (11/27 0909) Weight:  [80.2 kg (176 lb 12.9 oz)] 80.2 kg (176 lb 12.9 oz) (11/27 0001) Weight change:  Last BM Date: 11/15/12 Intake/Output from previous day: 11/26 0701 - 11/27 0700 In: 795 [P.O.:120; I.V.:675] Out: 300 [Urine:300] Intake/Output this shift:    PE:  General:alert and oriented, no complaints Heart:S1S2 RRR Lungs:clear Abd:+ BS soft, non tender Ext: feet warm, Lt groin cath site with large soft hematoma soft bruit.   Lab Results:  [facility] 11/17/12 0450 11/16/12 0915  WBC 6.9 5.1  HGB 10.2* 13.3  HCT 30.5* 38.8*  PLT 123* 121*   BMET  Basename 11/17/12 0450 11/16/12 0915  NA 139 141  K 4.3 4.0  CL 105 105  CO2 24 26  GLUCOSE 121* 105*  BUN 22 17  CREATININE 1.34 1.09  CALCIUM 8.7 9.0   No results found for this basename: TROPONINI:2,CK,MB:2 in the last 72 hours  Lab Results  Component Value Date   CHOL 147 09/02/2012   HDL 43 09/02/2012   LDLCALC 69 09/02/2012   TRIG 176* 09/02/2012   CHOLHDL 3.4 09/02/2012   No results found for this basename: HGBA1C     Lab Results  Component Value Date   TSH 3.655 09/30/2012       Studies/Results: 11/16/12 Successful PTA and stenting of a high-grade left external iliac artery stenosis jeopardizing circulation in both lower extremities via the left to right fem-fem crossover graft   Medications: I have reviewed the patient's current medications.    Marland Kitchen aspirin EC  325 mg Oral Daily  . atorvastatin  40 mg Oral q1800  . [COMPLETED] clopidogrel      . clopidogrel  75 mg Oral Q breakfast  . [EXPIRED] diazepam      . [COMPLETED] fentaNYL      . finasteride  5 mg Oral Daily  . [COMPLETED] heparin      .  influenza  inactive virus vaccine  0.5 mL Intramuscular Tomorrow-1000  . isosorbide mononitrate  30 mg Oral Daily  . metoprolol  50 mg Oral BID  . pantoprazole  40 mg Oral Daily  . pneumococcal 23 valent vaccine  0.5 mL Intramuscular Tomorrow-1000  . terazosin  1 mg Oral BID  . [DISCONTINUED] aspirin  81 mg Oral Daily   Assessment/Plan: Principal Problem:  *Claudication Active Problems:  GERD (gastroesophageal reflux disease)  BPH (benign prostatic hyperplasia)  History of colon cancer  PVD, Hx of L-R FFBPG 56yrs ago, now with LCIA disease compromising graft  CAD, CABG in [name] 2006. OP Myoview low risk 10/15/12  Dyslipidemia  COPD on exam (remote smoker)  S/P angioplasty with stent, to high grade Lt external iliac atery  11/16/12  Hematoma of groin, at cath site  Acute blood loss anemia, lt groin hematoma  PLAN:  Will check doppler of lt groin, ? CT also, H/H dropped 13.83/38.8 to 10.2/30.5  Also Cr up.   Lying in bed no pain.  Foley is in place. BP stable.  Recheck H/H also plts outpt were 144  Addendum:  Repeat cbc  CBC    Component Value Date/Time   WBC 6.0 11/17/2012 1111   WBC  5.7 09/26/2009 1338   RBC 3.12* 11/17/2012 1111   RBC 4.33 09/26/2009 1338   HGB 9.8* 11/17/2012 1111   HGB 13.7 09/26/2009 1338   HCT 29.0* 11/17/2012 1111   HCT 39.8 09/26/2009 1338   PLT 99* 11/17/2012 1111   PLT 165 09/26/2009 1338   MCV 92.9 11/17/2012 1111   MCV 92.1 09/26/2009 1338   MCH 31.4 11/17/2012 1111   MCH 31.7 09/26/2009 1338   MCHC 33.8 11/17/2012 1111   MCHC 34.4 09/26/2009 1338   RDW 13.2 11/17/2012 1111   RDW 13.0 09/26/2009 1338   LYMPHSABS 1.5 11/16/2012 0915   LYMPHSABS 1.7 09/26/2009 1338   MONOABS 0.3 11/16/2012 0915   MONOABS 0.5 09/26/2009 1338   EOSABS 0.3 11/16/2012 0915   EOSABS 0.2 09/26/2009 1338   BASOSABS 0.1 11/16/2012 0915   BASOSABS 0.1 09/26/2009 1338      LOS: 1 day   INGOLD,LAURA R 11/17/2012, 10:35 AM

## 2012-11-17 NOTE — Progress Notes (Signed)
Patient feeling well this afternoon. He ate lunch and ambulated states his legs feel good. He request to keep condom cath on for now. Plan is to transfer him to tele to stay for one more night.

## 2012-11-17 NOTE — Progress Notes (Signed)
I seen and evaluated the patient this morning along with the Nada Boozer, NP. I agree with her findings, examination as well as impression recommendations.  ~70 min hold for hemostasis yesterday - large hematoma, but soft.  H&H did drop as expected.  Doppler did not show Pseudoaneurysm.  He is otherwise doing well.  No c/o.  Given his age, increased Cr & drop in H&H, I think it is best to monitor him for another night to ensure he remains stable.   I discussed this with him & his Daughter -- they are in agreement.  Marykay Lex, M.D., M.S. [facility] [address] [place], [place]  [address]  [phone] Pager # [phone] 11/17/2012 12:48 PM

## 2012-11-18 LAB — BASIC METABOLIC PANEL
Calcium: 9 mg/dL (ref 8.4–10.5)
Creatinine, Ser: 1.33 mg/dL (ref 0.50–1.35)
GFR calc Af Amer: 55 mL/min — ABNORMAL LOW (ref >90)
GFR calc non Af Amer: 47 mL/min — ABNORMAL LOW (ref >90)
Sodium: 141 mEq/L (ref 135–145)

## 2012-11-18 LAB — CBC
MCH: 31.1 pg (ref 26.0–34.0)
MCHC: 33.8 g/dL (ref 30.0–36.0)
MCV: 91.9 fL (ref 78.0–100.0)
Platelets: 95 10*3/uL — ABNORMAL LOW (ref 150–400)
RDW: 13.1 % (ref 11.5–15.5)

## 2012-11-18 NOTE — Progress Notes (Signed)
Subjective: No complaints -- walked ok.   "When can I take a shower?"  Objective: Vital signs in last 24 hours: Temp:  [97.8 F (36.6 C)-98.4 F (36.9 C)] 98.1 F (36.7 C) (11/28 0510) Pulse Rate:  [73-78] 76  (11/28 0510) Resp:  [20-24] 20  (11/28 0510) BP: (120-150)/(52-70) 120/67 mmHg (11/28 0510) SpO2:  [90 %-97 %] 90 % (11/28 0510) Weight change:  Last BM Date: 11/15/12 Intake/Output from previous day: 11/27 0701 - 11/28 0700 In: 360 [P.O.:360] Out: 950 [Urine:950] Intake/Output this shift:    PE:  General:alert and oriented, no complaints Heart:S1S2 RRR Lungs: CTAB, non-labored, normal effort. Abd:+ BS soft, non tender Ext: feet warm, Lt groin cath site with large soft hematoma soft bruit.  Lab Results:  [facility] 11/18/12 0535 11/17/12 2146  WBC 5.5 6.3  HGB 9.2* 9.1*  HCT 27.2* 26.5*  PLT 95* 94*   BMET  Basename 11/18/12 0535 11/17/12 0450  NA 141 139  K 4.1 4.3  CL 107 105  CO2 27 24  GLUCOSE 106* 121*  BUN 27* 22  CREATININE 1.33 1.34  CALCIUM 9.0 8.7   No results found for this basename: TROPONINI:2,CK,MB:2 in the last 72 hours  Lab Results  Component Value Date   CHOL 147 09/02/2012   HDL 43 09/02/2012   LDLCALC 69 09/02/2012   TRIG 176* 09/02/2012   CHOLHDL 3.4 09/02/2012   No results found for this basename: HGBA1C     Lab Results  Component Value Date   TSH 3.655 09/30/2012     Studies/Results: 11/16/12 Successful PTA and stenting of a high-grade left external iliac artery stenosis jeopardizing circulation in both lower extremities via the left to right fem-fem crossover graft ~70 min hold for hemostasis post PTA/Stent - large hematoma, but soft. H&H did drop as expected. Doppler did not show Pseudoaneurysm.   Medications: I have reviewed the patient's current medications.    Marland Kitchen aspirin EC  325 mg Oral Daily  . atorvastatin  40 mg Oral q1800  . clopidogrel  75 mg Oral Q breakfast  . finasteride  5 mg Oral Daily  . [COMPLETED]  influenza  inactive virus vaccine  0.5 mL Intramuscular Tomorrow-1000  . isosorbide mononitrate  30 mg Oral Daily  . metoprolol  50 mg Oral BID  . pantoprazole  40 mg Oral Daily  . pneumococcal 23 valent vaccine  0.5 mL Intramuscular Tomorrow-1000  . terazosin  1 mg Oral BID   Assessment/Plan: Principal Problem:  *Claudication Active Problems:  GERD (gastroesophageal reflux disease)  BPH (benign prostatic hyperplasia)  History of colon cancer  PVD, Hx of L-R FFBPG 58yrs ago, now with LCIA disease compromising graft  CAD, CABG in [name] 2006. OP Myoview low risk 10/15/12  Dyslipidemia  COPD on exam (remote smoker)  S/P angioplasty with stent, to high grade Lt external iliac atery  11/16/12  Hematoma of groin, at cath site  Acute blood loss anemia, lt groin hematoma  BP & HR stable.  Renal function & H/H stable. Groin looks stable - bruit is due to graft.  Ok for d/c on current Rx.   LOS: 2 days   HARDING,DAVID W 11/18/2012, 9:09 AM

## 2012-11-27 ENCOUNTER — Encounter (HOSPITAL_COMMUNITY): Payer: Self-pay | Admitting: *Deleted

## 2012-11-27 ENCOUNTER — Emergency Department (HOSPITAL_COMMUNITY)
Admission: EM | Admit: 2012-11-27 | Discharge: 2012-11-27 | Disposition: A | Discharge status: Home / Self Care | Payer: Medicare Other | Attending: Emergency Medicine | Admitting: Emergency Medicine

## 2012-11-27 DIAGNOSIS — Z8673 Personal history of transient ischemic attack (TIA), and cerebral infarction without residual deficits: Secondary | ICD-10-CM | POA: Insufficient documentation

## 2012-11-27 DIAGNOSIS — J4489 Other specified chronic obstructive pulmonary disease: Secondary | ICD-10-CM | POA: Insufficient documentation

## 2012-11-27 DIAGNOSIS — I739 Peripheral vascular disease, unspecified: Secondary | ICD-10-CM | POA: Insufficient documentation

## 2012-11-27 DIAGNOSIS — M79609 Pain in unspecified limb: Secondary | ICD-10-CM

## 2012-11-27 DIAGNOSIS — Z7982 Long term (current) use of aspirin: Secondary | ICD-10-CM | POA: Insufficient documentation

## 2012-11-27 DIAGNOSIS — J449 Chronic obstructive pulmonary disease, unspecified: Secondary | ICD-10-CM | POA: Insufficient documentation

## 2012-11-27 DIAGNOSIS — Z87891 Personal history of nicotine dependence: Secondary | ICD-10-CM | POA: Insufficient documentation

## 2012-11-27 DIAGNOSIS — E7800 Pure hypercholesterolemia, unspecified: Secondary | ICD-10-CM | POA: Insufficient documentation

## 2012-11-27 DIAGNOSIS — I10 Essential (primary) hypertension: Secondary | ICD-10-CM | POA: Insufficient documentation

## 2012-11-27 DIAGNOSIS — Z79899 Other long term (current) drug therapy: Secondary | ICD-10-CM | POA: Insufficient documentation

## 2012-11-27 DIAGNOSIS — Z9861 Coronary angioplasty status: Secondary | ICD-10-CM | POA: Insufficient documentation

## 2012-11-27 DIAGNOSIS — Z95820 Peripheral vascular angioplasty status with implants and grafts: Secondary | ICD-10-CM

## 2012-11-27 DIAGNOSIS — Z8719 Personal history of other diseases of the digestive system: Secondary | ICD-10-CM | POA: Insufficient documentation

## 2012-11-27 NOTE — ED Notes (Signed)
Pt using urinal with assistance at the time.

## 2012-11-27 NOTE — ED Provider Notes (Signed)
Pt with doppler-able pulses, vascular arterial study shows patent vessels, no pseudo aneurysm.  Pt with extensive bruising which likely accounts for pain with walking, not claudication.  Will let Dr. Hazle Coca group know of pt's family concerns and help encourage follow up as outpt.  Neg Homan's sign.  Pt is on plavix which may account for bruising considering he had recent stenting.     Medical screening examination/treatment/procedure(s) were conducted as a shared visit with non-physician practitioner(s) and myself.  I personally evaluated the patient during the encounter     ECG at time 11:55 shows SR at rate 61, normal axis, borderline repol abn's, normal intervals.    Gavin Pound. Shahram Alexopoulos, MD 11/27/12 1425

## 2012-11-27 NOTE — ED Notes (Signed)
Pt presents to department for evaluation of L sided groin pain and bruising. States he had cardiac catherization on 11/26, complications with bleeding from site after procedure. Bruising noted to inside of L thigh, radiating around to side of leg and flank area. Pt states bruising and pain has increased over the past several days. Also states increasing SOB. Lung sounds clear and equal bilaterally. 7/10 pain at present.

## 2012-11-27 NOTE — ED Notes (Signed)
Pt and family member reports that pt had cath done 11/26 left groin, had problems following procedure due to bleeding. Family member reports pt not acting like his normal self since procedure, having sob, pain, fatigue. Reports swelling to left groin yesterday that has since resolved but has pain to left leg.

## 2012-11-27 NOTE — ED Notes (Signed)
Pt brought back to room; myself and Matt, NT undressed pt, placed in gown, on monitor, continuous pulse oximetry and blood pressure cuff; family at bedside

## 2012-11-27 NOTE — ED Notes (Signed)
Vascular tech is at bedside at this time

## 2012-11-27 NOTE — Progress Notes (Signed)
VASCULAR LAB PRELIMINARY  PRELIMINARY  PRELIMINARY  PRELIMINARY  Left lower extremity arterial duplex completed.    Preliminary report:  No pseudoaneurysm or AV fistula noted in the left lower extremity.  No DVT noted.  Arterial flow is adequate throughout the left lower extremity.  Hematoma vs enlarged lymph node noted in the left groin.  Neelah Mannings, RVT 11/27/2012, 2:17 PM

## 2012-11-27 NOTE — ED Provider Notes (Signed)
History     CSN: [id number]  Arrival date & time 11/27/12  1058   First MD Initiated Contact with Patient 11/27/12 1206      Chief Complaint  Patient presents with  . Groin Pain    (Consider location/radiation/quality/duration/timing/severity/associated sxs/prior treatment) HPI  Yurem Viner is a 78 y.o. male who had arterial stenting by Dr. Gery Pray on 11/26. Patient had a left to right femoral to femoral bypass grafting 20 years ago there was a stenosis proximal to the graft. After the procedure,  patient developed a hematoma at the site he was discharged home and has not yet followed up with a vascular surgeon. He is brought in today by his daughter who states that the ecchymoses is expanding in his pain and coolness of the left leg is increasing. Patient denies symptoms.  Past Medical History  Diagnosis Date  . ASHD (arteriosclerotic heart disease)   . COPD (chronic obstructive pulmonary disease)   . GERD (gastroesophageal reflux disease)   . BPH (benign prostatic hyperplasia)   . Stroke 01/1999    "mild" (11/16/2012)  . Hypertension   . Hypercholesteremia   . Exertional dyspnea   . S/P angioplasty with stent, to high grade Lt external iliac atery  11/16/12 11/17/2012  . Hematoma of groin, at cath site 11/17/2012  . Acute blood loss anemia, lt groin hematoma 11/17/2012    Past Surgical History  Procedure Date  . Cholecystectomy open   . Iliac artery stent 11/16/2012    left external  . Colectomy ~ 2006    "partial" (11/16/2012)    History reviewed. No pertinent family history.  History  Substance Use Topics  . Smoking status: Former Smoker -- 2.0 packs/day for 30 years    Types: Cigarettes  . Smokeless tobacco: Never Used     Comment: 11/16/2012 "quit smoking cigarettes in the 1970's"  . Alcohol Use: 3.0 oz/week    5 Glasses of wine per week     Comment: 11/16/2012 "wine cooler 4-5 nights/wk"      Review of Systems  Constitutional: Negative for fever.   Respiratory: Negative for shortness of breath.   Cardiovascular: Negative for chest pain.  Gastrointestinal: Negative for nausea, vomiting, abdominal pain and diarrhea.  All other systems reviewed and are negative.    Allergies  Review of patient's allergies indicates no known allergies.  Home Medications   Current Outpatient Rx  Name  Route  Sig  Dispense  Refill  . ASPIRIN 81 MG PO TABS   Oral   Take 81 mg by mouth daily.         Marland Kitchen CLOPIDOGREL BISULFATE 75 MG PO TABS   Oral   Take 75 mg by mouth daily with breakfast.         . FINASTERIDE 5 MG PO TABS   Oral   Take 5 mg by mouth daily.         . ISOSORBIDE MONONITRATE ER 30 MG PO TB24   Oral   Take 30 mg by mouth daily.         Marland Kitchen METOPROLOL TARTRATE 50 MG PO TABS   Oral   Take 50 mg by mouth 2 (two) times daily.         Marland Kitchen PANTOPRAZOLE SODIUM 40 MG PO TBEC   Oral   Take 40 mg by mouth daily.         Marland Kitchen SIMVASTATIN 80 MG PO TABS   Oral   Take 40 mg by mouth at bedtime.         Marland Kitchen  TERAZOSIN HCL 1 MG PO CAPS   Oral   Take 1 mg by mouth 2 (two) times daily.           BP 136/54  Pulse 63  Temp 98.6 F (37 C) (Oral)  Resp 18  SpO2 98%  Physical Exam  Nursing note and vitals reviewed. Constitutional: He is oriented to person, place, and time. He appears well-developed and well-nourished. No distress.  HENT:  Head: Normocephalic.  Eyes: Conjunctivae normal and EOM are normal.  Cardiovascular: Normal rate.        Dorsalis pedis not palpable however is auscultated easily with Doppler  Pulmonary/Chest: Effort normal. No stridor.  Abdominal: Soft. Bowel sounds are normal.  Musculoskeletal: Normal range of motion.  Neurological: He is alert and oriented to person, place, and time.  Psychiatric: He has a normal mood and affect.    ED Course  Procedures (including critical care time)  Labs Reviewed - No data to display No results found.   1. S/P angioplasty with stent   2. PVD  (peripheral vascular disease)       MDM  Vascular study shows no occlusion or pseudoaneurysm. There is a inguinal hematoma versus lymph node.  Consult made to Dr. Gery Pray to expedite followup.  Cardiology consult from Dr. Jorge Mandril he appreciated: He will have their office call the family to set up an expedited appointment on Monday.    Pt verbalized understanding and agrees with care plan. Outpatient follow-up and return precautions given.         Joni Reining Dory Demont, PA-C 11/27/12 1500

## 2012-12-03 ENCOUNTER — Other Ambulatory Visit (HOSPITAL_COMMUNITY): Payer: Self-pay | Admitting: Cardiology

## 2012-12-09 ENCOUNTER — Encounter (HOSPITAL_COMMUNITY): Payer: PRIVATE HEALTH INSURANCE

## 2012-12-17 ENCOUNTER — Ambulatory Visit (HOSPITAL_COMMUNITY)
Admission: RE | Admit: 2012-12-17 | Discharge: 2012-12-17 | Disposition: A | Discharge status: Home / Self Care | Payer: Medicare Other | Source: Ambulatory Visit | Attending: Cardiovascular Disease | Admitting: Cardiovascular Disease

## 2012-12-17 ENCOUNTER — Other Ambulatory Visit (HOSPITAL_COMMUNITY): Payer: Self-pay | Admitting: Cardiovascular Disease

## 2012-12-17 DIAGNOSIS — I739 Peripheral vascular disease, unspecified: Secondary | ICD-10-CM | POA: Insufficient documentation

## 2012-12-17 DIAGNOSIS — Z95820 Peripheral vascular angioplasty status with implants and grafts: Secondary | ICD-10-CM

## 2012-12-17 HISTORY — PX: OTHER SURGICAL HISTORY: SHX169

## 2012-12-17 NOTE — Progress Notes (Signed)
Left lower extremity arterial duplex completed for post stent. Julian Hale

## 2013-01-19 ENCOUNTER — Other Ambulatory Visit: Payer: Self-pay | Admitting: Family Medicine

## 2013-04-03 IMAGING — CT CT ANGIO AOBIFEM WO/W CM
1 of 11 series · 2 of 16 positions shown, 3 images · IV contrast (omnipaque)
Comparison: None.

CLINICAL DATA: Peripheral vascular disease.  Bilateral leg pain.

CT ANGIOGRAPHY OF ABDOMINAL AORTA WITH ILIOFEMORAL RUNOFF
TECHNIQUE: Multidetector CT imaging of the abdomen, pelvis and
lower extremities was performed using the standard protocol during
bolus administration of intravenous contrast.  Multiplanar CT image
reconstructions including MIPs were obtained to evaluate the
vascular anatomy.
Contrast: 100mL OMNIPAQUE IOHEXOL 350 MG/ML SOLN

[soft tissue · axial · 0.85mm/px · z∈[-855,-427]mm · 2 of 642 slices shown, 3 images]
[im 214/642  soft-tissue]
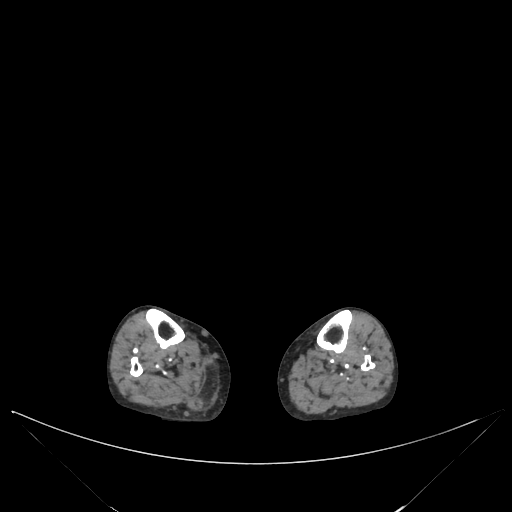
[im 214/642  bone]
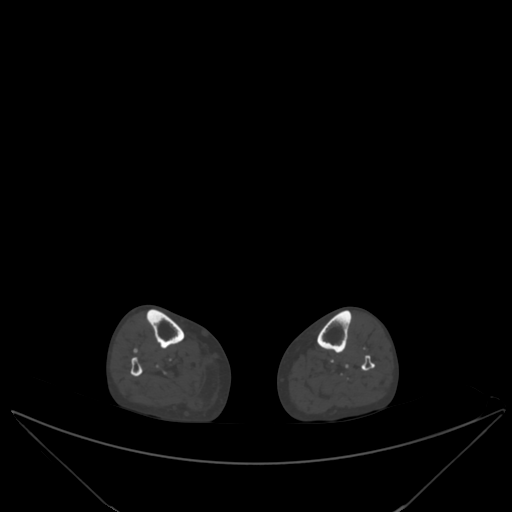
[im 428/642  soft-tissue]
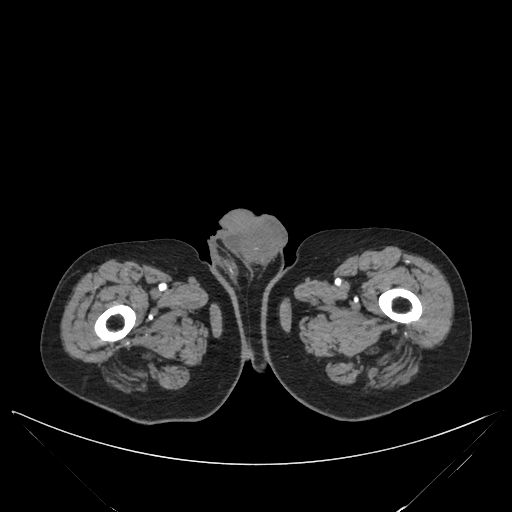

[2 of 16 positions shown; findings below may reference images not displayed]

FINDINGS: The aorta is nonaneurysmal.  There is a focal irregular plaque just
above the renal arteries in the left side of the aorta.  No
significant stenosis of the aorta.

A single left renal artery and to right renal arteries are patent.
Celiac and SMA are patent.  IMA is patent.

The right common iliac artery is occluded at its origin.  Right
external iliac artery is also occluded.  Right internal iliac
artery branches reconstitute.  Left common  iliac arteries patent.
There is marked narrowing at the origin of the left internal iliac
artery with post stenotic dilatation measuring 12 mm in caliber.
There is a critical narrowing of the left external iliac artery,
just above the inguinal ligament.  See table position 269.

Left femoral to right femoral artery is opacified.  There is mild
narrowing at the anastomosis to the left common femoral artery.
Right common femoral artery anastomosis is patent.

The right superficial femoral artery and profunda femoral arteries
are patent. Moderate disease of the right superficial femoral
artery and the abductor canal. Right popliteal artery is patent.
There is two-vessel runoff to the right ankle via the anterior
tibial and peroneal arteries.  The proximal posterior tibial artery
opacifies.

Left common femoral, profunda femoral, and superficial femoral
artery are patent.  Mild disease in the abductor canal.  Left
popliteal artery is patent.  Two-vessel runoff to the left ankle
via the peroneal and anterior tibial arteries.  The proximal
posterior tibial artery is occluded.

The right greater saphenous vein is dilated leading to varices in
the medial right thigh and calf.  These can certainly result and
symptomatology.

Within the abdomen, a prominent hiatal hernia is partially imaged.
Visualized liver and spleen are within normal limits.  Post
cholecystectomy.

Pancreas, adrenal glands are within normal limits.  Several
hypodensities are scattered throughout the kidneys.

No free fluid in the abdomen.  Bladder is decompressed.

L3 superior endplate central depression versus Schmorl's node has a
chronic appearance.

Left inguinal hernia contains adipose tissue.

There is deformity of the right inferior pubic ramus compatible
with healed fracture and deformity.  No definite acute pelvic
fracture.

 Review of the MIP images confirms the above findings.
IMPRESSION: Chronic occlusion of the right common and external iliac arteries.

Significant stenosis of the left external iliac artery.

Left femoral to right femoral bypass graft patent with some
narrowing at the left anastomosis.

No significant stenosis in the femoral popliteal system with two-
vessel runoff bilaterally.

Dilated right greater saphenous vein and lower extremity
varicosities are identified.  This could certainly result in
symptomatology.

Significant stenosis at the origin of the left internal iliac
artery.

## 2013-04-06 ENCOUNTER — Other Ambulatory Visit (HOSPITAL_COMMUNITY): Payer: Self-pay | Admitting: Physician Assistant

## 2013-04-06 DIAGNOSIS — R0600 Dyspnea, unspecified: Secondary | ICD-10-CM

## 2013-04-12 ENCOUNTER — Ambulatory Visit (HOSPITAL_COMMUNITY)
Admission: RE | Admit: 2013-04-12 | Discharge: 2013-04-12 | Disposition: A | Discharge status: Home / Self Care | Payer: Medicare Other | Source: Ambulatory Visit | Attending: Cardiovascular Disease | Admitting: Cardiovascular Disease

## 2013-04-12 ENCOUNTER — Other Ambulatory Visit: Payer: Self-pay

## 2013-04-12 ENCOUNTER — Encounter: Payer: Self-pay | Admitting: Family Medicine

## 2013-04-12 ENCOUNTER — Ambulatory Visit (INDEPENDENT_AMBULATORY_CARE_PROVIDER_SITE_OTHER): Payer: Medicare Other | Admitting: Family Medicine

## 2013-04-12 VITALS — BP 128/60 | HR 56 | Wt 175.0 lb

## 2013-04-12 DIAGNOSIS — I10 Essential (primary) hypertension: Secondary | ICD-10-CM | POA: Insufficient documentation

## 2013-04-12 DIAGNOSIS — I059 Rheumatic mitral valve disease, unspecified: Secondary | ICD-10-CM | POA: Insufficient documentation

## 2013-04-12 DIAGNOSIS — J449 Chronic obstructive pulmonary disease, unspecified: Secondary | ICD-10-CM

## 2013-04-12 DIAGNOSIS — R0609 Other forms of dyspnea: Secondary | ICD-10-CM | POA: Insufficient documentation

## 2013-04-12 DIAGNOSIS — J4489 Other specified chronic obstructive pulmonary disease: Secondary | ICD-10-CM | POA: Insufficient documentation

## 2013-04-12 DIAGNOSIS — I359 Nonrheumatic aortic valve disorder, unspecified: Secondary | ICD-10-CM | POA: Insufficient documentation

## 2013-04-12 DIAGNOSIS — R0989 Other specified symptoms and signs involving the circulatory and respiratory systems: Secondary | ICD-10-CM | POA: Insufficient documentation

## 2013-04-12 DIAGNOSIS — N400 Enlarged prostate without lower urinary tract symptoms: Secondary | ICD-10-CM

## 2013-04-12 DIAGNOSIS — M549 Dorsalgia, unspecified: Secondary | ICD-10-CM

## 2013-04-12 DIAGNOSIS — Z8673 Personal history of transient ischemic attack (TIA), and cerebral infarction without residual deficits: Secondary | ICD-10-CM | POA: Insufficient documentation

## 2013-04-12 DIAGNOSIS — I2510 Atherosclerotic heart disease of native coronary artery without angina pectoris: Secondary | ICD-10-CM | POA: Insufficient documentation

## 2013-04-12 DIAGNOSIS — I379 Nonrheumatic pulmonary valve disorder, unspecified: Secondary | ICD-10-CM | POA: Insufficient documentation

## 2013-04-12 DIAGNOSIS — I079 Rheumatic tricuspid valve disease, unspecified: Secondary | ICD-10-CM | POA: Insufficient documentation

## 2013-04-12 DIAGNOSIS — R0600 Dyspnea, unspecified: Secondary | ICD-10-CM

## 2013-04-12 HISTORY — PX: TRANSTHORACIC ECHOCARDIOGRAM: SHX275

## 2013-04-12 MED ORDER — HYDROCODONE-ACETAMINOPHEN 5-325 MG PO TABS: 1.0000 | ORAL_TABLET | Freq: Four times a day (QID) | ORAL | Status: DC | PRN

## 2013-04-12 NOTE — Progress Notes (Signed)
[facility]   2D echo completed 04/12/2013.   Cindy Akhila Mahnken, RDCS  

## 2013-04-12 NOTE — Progress Notes (Signed)
  Subjective:    Patient ID: Julian Hale, male    DOB: Jan 19, 1926, 79 y.o.   MRN: [id number]  HPI He is brought in by his daughter complaining of mid back pain. He has a long history of intermittent difficulty with pain. In the past he's used codeine with good results. He cites no particular incident other than bending forward and causing mid back pain. Also for the last month or 2 he has noted difficulty with incomplete emptying , slight leakage and decreased stream. He also complains of nocturia X5.presently he is taking Proscar. He is also being followed by cardiology and apparently continues to have difficulty with shortness of breath. An echocardiogram was done recently.   Review of Systems     Objective:   Physical Exam Alert and in no distress. Pulse ox is 96. Minimal tenderness palpation in the midthoracic area. Good motion of his back. Lungs are clear to auscultation.       Assessment & Plan:  Mid back pain - Plan: HYDROcodone-acetaminophen (NORCO/VICODIN) 5-325 MG per tablet  BPH (benign prostatic hyperplasia) - Plan: POCT occult blood stool, Ambulatory referral to Urology  ASHD (arteriosclerotic heart disease)  COPD on exam (remote smoker) I will place him on pain medication. I will also refer to urology since he presently is already on a finasteride. His symptoms sound like worsening obstruction. Encouraged the daughter to call cardiology if he continues had difficulty with shortness of breath.

## 2013-04-12 NOTE — Telephone Encounter (Signed)
Called hydro/codon in

## 2013-04-13 ENCOUNTER — Telehealth: Payer: Self-pay | Admitting: Family Medicine

## 2013-04-13 NOTE — Telephone Encounter (Signed)
LM

## 2013-04-24 ENCOUNTER — Other Ambulatory Visit: Payer: Self-pay | Admitting: Family Medicine

## 2013-04-26 ENCOUNTER — Encounter: Payer: Self-pay | Admitting: Family Medicine

## 2013-07-13 ENCOUNTER — Telehealth: Payer: Self-pay | Admitting: Family Medicine

## 2013-07-13 ENCOUNTER — Ambulatory Visit (INDEPENDENT_AMBULATORY_CARE_PROVIDER_SITE_OTHER): Payer: Medicare Other | Admitting: Family Medicine

## 2013-07-13 ENCOUNTER — Encounter: Payer: Self-pay | Admitting: Family Medicine

## 2013-07-13 ENCOUNTER — Other Ambulatory Visit: Payer: Self-pay

## 2013-07-13 VITALS — BP 110/60 | HR 83 | Temp 97.5°F | Wt 171.0 lb

## 2013-07-13 DIAGNOSIS — R319 Hematuria, unspecified: Secondary | ICD-10-CM

## 2013-07-13 DIAGNOSIS — Z95820 Peripheral vascular angioplasty status with implants and grafts: Secondary | ICD-10-CM

## 2013-07-13 DIAGNOSIS — I739 Peripheral vascular disease, unspecified: Secondary | ICD-10-CM

## 2013-07-13 DIAGNOSIS — N400 Enlarged prostate without lower urinary tract symptoms: Secondary | ICD-10-CM

## 2013-07-13 DIAGNOSIS — Z9889 Other specified postprocedural states: Secondary | ICD-10-CM

## 2013-07-13 LAB — POCT URINALYSIS DIPSTICK
Ketones, UA: NEGATIVE
Nitrite, UA: NEGATIVE
Protein, UA: 30
Urobilinogen, UA: 4
pH, UA: 5

## 2013-07-13 MED ORDER — SULFAMETHOXAZOLE-TMP DS 800-160 MG PO TABS: 1.0000 | ORAL_TABLET | Freq: Two times a day (BID) | ORAL | Status: DC

## 2013-07-13 NOTE — Progress Notes (Signed)
  Subjective:    Patient ID: Julian Hale, male    DOB: 08/03/1926, 79 y.o.   MRN: [id number]  HPI He complains of a three-week history of seeing blood in his urine but just informed his daughter last night. He has urgency and frequency hesitancy, decreased stream. No fever, chills, abdominal or back pain. He continues on medications listed in the chart and thinks they are not helping. Also complains of increased difficulty with bilateral leg pain. He did have a procedure done in end of November and after that for short period time he did well however in the last several months he is not walking due to bilateral leg pain. He was supposed to followup with cardiology concerning this however he has not done it yet.   Review of Systems     Objective:   Physical Exam alert and in no distress. Tympanic membranes and canals are normal. Throat is clear. Tonsils are normal. Neck is supple without adenopathy or thyromegaly. Cardiac exam shows a regular sinus rhythm without murmurs or gallops. Lungs are clear to auscultation. 1+ dorsalis pedis pulses normal. Feet were warm and dry. No mottling noted.       Assessment & Plan:  Blood in urine - Plan: POCT Urinalysis Dipstick, Ambulatory referral to Urology, CULTURE, URINE COMPREHENSIVE, sulfamethoxazole-trimethoprim (BACTRIM DS) 800-160 MG per tablet  Claudication - Plan: Ambulatory referral to Cardiology  PVD, Hx of L-R FFBPG 92yrs ago, now with LCIA disease compromising graft - Plan: Ambulatory referral to Cardiology  S/P angioplasty with stent, to high grade Lt external iliac atery  11/16/12 - Plan: Ambulatory referral to Cardiology  BPH (benign prostatic hyperplasia) - Plan: Ambulatory referral to Urology, CULTURE, URINE COMPREHENSIVE  will cover him with Septra and get a culture. Discussed the fact that he needs to followup with cardiology and with urology. He apparently had bad experiences at both places however further discussion indicates  that he was expecting more definitive one time care.

## 2013-07-13 NOTE — Telephone Encounter (Signed)
DR.LALONDE DO YOU KNOW ANYTHING ABOUT THIS

## 2013-07-13 NOTE — Telephone Encounter (Signed)
RESENT MED TO [facility]

## 2013-07-18 NOTE — Progress Notes (Signed)
Quick Note:  PT DAUGHTER CALLED BACK AND SAID PT WAS FELLING BETTER PLEASE ADVISE ON WHAT YOU WANT TO DO ______

## 2013-07-18 NOTE — Progress Notes (Signed)
Quick Note:  CALLED DAUGHTER DENISE LEFT MESSAGE FOR HER TO CALL ME BACK AND LET ME KNOW HOW HER DAD IS DOING ______

## 2013-07-22 ENCOUNTER — Ambulatory Visit: Payer: PRIVATE HEALTH INSURANCE | Admitting: Cardiovascular Disease

## 2013-07-22 ENCOUNTER — Other Ambulatory Visit: Payer: Self-pay | Admitting: Family Medicine

## 2013-09-21 DIAGNOSIS — C679 Malignant neoplasm of bladder, unspecified: Secondary | ICD-10-CM

## 2013-09-21 HISTORY — DX: Malignant neoplasm of bladder, unspecified: C67.9

## 2013-10-10 ENCOUNTER — Ambulatory Visit (INDEPENDENT_AMBULATORY_CARE_PROVIDER_SITE_OTHER): Payer: Medicare Other | Admitting: Cardiovascular Disease

## 2013-10-10 ENCOUNTER — Encounter: Payer: Self-pay | Admitting: Cardiovascular Disease

## 2013-10-10 VITALS — BP 178/70 | HR 58 | Ht 68.0 in | Wt 169.8 lb

## 2013-10-10 DIAGNOSIS — I2510 Atherosclerotic heart disease of native coronary artery without angina pectoris: Secondary | ICD-10-CM

## 2013-10-10 DIAGNOSIS — E785 Hyperlipidemia, unspecified: Secondary | ICD-10-CM

## 2013-10-10 DIAGNOSIS — I739 Peripheral vascular disease, unspecified: Secondary | ICD-10-CM

## 2013-10-10 DIAGNOSIS — R0609 Other forms of dyspnea: Secondary | ICD-10-CM

## 2013-10-10 NOTE — Progress Notes (Signed)
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 He was referred to Dr Allyson Sabal by Dr Susann Givens for claudication. The pt has a history of L-R FFBPG in [name] 72yrs ago. He also had CABG in 2006 in [name] (no details available). OP CTA revealed LCIA stenosis that is compromising the FFBPG. He is admitted now for PVA. He did have a Myoview in Oct that was low risk. Hiss EF at [facility] was > 55%.he underwent angiography 11/16/13 revealing a high-grade left external iliac artery stenosis which was stented. He had intact from them crossover graft. A followup Doppler studies were favorable. He sought [facility] in the office 04/06/13 complaining of increasing dyspnea on exertion. A 2-D echocardiogram performed 04/12/13 revealed normal LV systolic function with grade 2 diastolic dysfunction. He complains of increasing dyspnea as well as increasing Lescol limiting claudication.   Current Outpatient Prescriptions  Medication Sig Dispense Refill  . aspirin 81 MG tablet Take 81 mg by mouth daily.      . finasteride (PROSCAR) 5 MG tablet Take 5 mg by mouth daily.      Marland Kitchen HYDROcodone-acetaminophen (NORCO/VICODIN) 5-325 MG per tablet Take 1 tablet by mouth every 6 (six) hours as needed for pain.  30 tablet  0  . isosorbide mononitrate (IMDUR) 30 MG 24 hr tablet TAKE 1 TABLET DAILY  90 tablet  0  . metoprolol (LOPRESSOR) 50 MG tablet TAKE 1 TABLET TWICE A DAY  180 tablet  0  . omeprazole (PRILOSEC) 20 MG capsule TAKE 1 CAPSULE DAILY  90 capsule  2  . simvastatin (ZOCOR) 80 MG tablet TAKE ONE-HALF (1/2) TABLET ONCE A DAY  45 tablet  1  . terazosin (HYTRIN) 1 MG capsule TAKE 1 CAPSULE TWICE A DAY  180 capsule  2   No current facility-administered medications for this visit.    No Known Allergies  History     Social History  . Marital Status: Widowed    Spouse Name: N/A    Number of Children: N/A  . Years of Education: N/A   Occupational History  . Not on file.   Social History Main Topics  . Smoking status: Former Smoker -- 2.00 packs/day for 30 years    Types: Cigarettes  . Smokeless tobacco: Never Used     Comment: 11/16/2012 "quit smoking cigarettes in the 1970's"  . Alcohol Use: 3.0 oz/week    5 Glasses of wine per week     Comment: 11/16/2012 "wine cooler 4-5 nights/wk"  . Drug Use: No  . Sexual Activity: No   Other Topics Concern  . 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     Review of Systems: General: negative for chills, fever, night sweats or weight changes.  Cardiovascular: negative for chest pain, dyspnea on exertion, edema, orthopnea, palpitations, paroxysmal nocturnal dyspnea or shortness of breath Dermatological: negative for rash Respiratory: negative for cough or wheezing Urologic: negative for hematuria Abdominal: negative for nausea, vomiting, diarrhea, bright red blood per rectum, melena, or hematemesis Neurologic: negative for visual changes, syncope, or dizziness All other systems reviewed and are otherwise negative except as noted above.    Blood pressure 178/70, pulse 58, height 5'  8" (1.727 m), weight 169 lb 12.8 oz (77.021 kg).  General appearance: alert and no distress Neck: no adenopathy, no carotid bruit, no JVD, supple, symmetrical, trachea midline and thyroid not enlarged, symmetric, no tenderness/mass/nodules Lungs: clear to auscultation bilaterally Heart: regular rate and rhythm, S1, S2 normal, no murmur, click, rub or gallop Extremities: extremities normal, atraumatic, no cyanosis or edema and 2+ femoral pulses with a left femoral bruit. Fem-fem crossover graft intact  EKG sinus bradycardia at 58 with lateral Q waves and lateral T-wave inversion new  since his prior electrocardiogram  ASSESSMENT AND PLAN:   CAD, CABG in [name] 2006. OP Myoview low risk 10/15/12 Status post coronary artery bypass grafting in [place] in 2006. He had a negative Myoview stress test October of 2013. He had normal 2-D echo in April of this year. He's had progressive dyspnea on exertion but denies chest pain. Concern this may be "an anginal equivalent am going to repeat his pharmacologic Myoview stress test.  Claudication Post left to right fem-fem crossover graft in 20 years ago. Because of claudication I. Angiogram him 11/16/12 revealing a high-grade left external iliac artery stenosis which was stented. He had an intact fem-fem crossover graft. His followup Dopplers were favorable. He had recurrent left eye limiting claudication. I'm going to repeat his largely Doppler studies to see whether he's had progression of disease.  Dyslipidemia On statin therapy followed by his PCP. His last lipid profile performed one year ago in our files revealed total cholesterol 147, LDL of 69 and HDL of 43      Runell Gess MD [facility], [facility] 10/10/2013 5:46 PM

## 2013-10-10 NOTE — Assessment & Plan Note (Signed)
Post left to right fem-fem crossover graft in 20 years ago. Because of claudication I. Angiogram him 11/16/12 revealing a high-grade left external iliac artery stenosis which was stented. He had an intact fem-fem crossover graft. His followup Dopplers were favorable. He had recurrent left eye limiting claudication. I'm going to repeat his largely Doppler studies to see whether he's had progression of disease.

## 2013-10-10 NOTE — Patient Instructions (Addendum)
  We will see you back in follow up after the tests.  Dr Allyson Sabal has ordered a lexiscan myoview and lower extremity arterial dopplers

## 2013-10-10 NOTE — Assessment & Plan Note (Signed)
On statin therapy followed by his PCP. His last lipid profile performed one year ago in our files revealed total cholesterol 147, LDL of 69 and HDL of 43

## 2013-10-10 NOTE — Assessment & Plan Note (Signed)
Status post coronary artery bypass grafting in [place] in 2006. He had a negative Myoview stress test October of 2013. He had normal 2-D echo in April of this year. He's had progressive dyspnea on exertion but denies chest pain. Concern this may be "an anginal equivalent am going to repeat his pharmacologic Myoview stress test.

## 2013-10-11 ENCOUNTER — Telehealth (HOSPITAL_COMMUNITY): Payer: Self-pay | Admitting: *Deleted

## 2013-10-11 ENCOUNTER — Encounter: Payer: Self-pay | Admitting: Cardiovascular Disease

## 2013-10-12 ENCOUNTER — Telehealth: Payer: Self-pay | Admitting: *Deleted

## 2013-10-12 NOTE — Telephone Encounter (Signed)
Forms faxed to [facility] for FMLA for Ms  Julian Hale (daughter)

## 2013-10-17 ENCOUNTER — Emergency Department (HOSPITAL_COMMUNITY)
Admission: EM | Admit: 2013-10-17 | Discharge: 2013-10-17 | Disposition: A | Discharge status: Home / Self Care | Payer: Medicare Other | Attending: Emergency Medicine | Admitting: Emergency Medicine

## 2013-10-17 ENCOUNTER — Encounter (HOSPITAL_COMMUNITY): Payer: Self-pay | Admitting: Emergency Medicine

## 2013-10-17 DIAGNOSIS — Z79899 Other long term (current) drug therapy: Secondary | ICD-10-CM | POA: Insufficient documentation

## 2013-10-17 DIAGNOSIS — I10 Essential (primary) hypertension: Secondary | ICD-10-CM | POA: Insufficient documentation

## 2013-10-17 DIAGNOSIS — Z862 Personal history of diseases of the blood and blood-forming organs and certain disorders involving the immune mechanism: Secondary | ICD-10-CM | POA: Insufficient documentation

## 2013-10-17 DIAGNOSIS — N401 Enlarged prostate with lower urinary tract symptoms: Secondary | ICD-10-CM | POA: Insufficient documentation

## 2013-10-17 DIAGNOSIS — Z87891 Personal history of nicotine dependence: Secondary | ICD-10-CM | POA: Insufficient documentation

## 2013-10-17 DIAGNOSIS — J4489 Other specified chronic obstructive pulmonary disease: Secondary | ICD-10-CM | POA: Insufficient documentation

## 2013-10-17 DIAGNOSIS — Z8673 Personal history of transient ischemic attack (TIA), and cerebral infarction without residual deficits: Secondary | ICD-10-CM | POA: Insufficient documentation

## 2013-10-17 DIAGNOSIS — I2510 Atherosclerotic heart disease of native coronary artery without angina pectoris: Secondary | ICD-10-CM | POA: Insufficient documentation

## 2013-10-17 DIAGNOSIS — J449 Chronic obstructive pulmonary disease, unspecified: Secondary | ICD-10-CM | POA: Insufficient documentation

## 2013-10-17 DIAGNOSIS — N138 Other obstructive and reflux uropathy: Secondary | ICD-10-CM | POA: Insufficient documentation

## 2013-10-17 DIAGNOSIS — Z9861 Coronary angioplasty status: Secondary | ICD-10-CM | POA: Insufficient documentation

## 2013-10-17 DIAGNOSIS — Z87828 Personal history of other (healed) physical injury and trauma: Secondary | ICD-10-CM | POA: Insufficient documentation

## 2013-10-17 DIAGNOSIS — Z951 Presence of aortocoronary bypass graft: Secondary | ICD-10-CM | POA: Insufficient documentation

## 2013-10-17 DIAGNOSIS — E7800 Pure hypercholesterolemia, unspecified: Secondary | ICD-10-CM | POA: Insufficient documentation

## 2013-10-17 DIAGNOSIS — K219 Gastro-esophageal reflux disease without esophagitis: Secondary | ICD-10-CM | POA: Insufficient documentation

## 2013-10-17 DIAGNOSIS — R350 Frequency of micturition: Secondary | ICD-10-CM | POA: Insufficient documentation

## 2013-10-17 DIAGNOSIS — R319 Hematuria, unspecified: Secondary | ICD-10-CM | POA: Insufficient documentation

## 2013-10-17 DIAGNOSIS — Z7982 Long term (current) use of aspirin: Secondary | ICD-10-CM | POA: Insufficient documentation

## 2013-10-17 LAB — COMPREHENSIVE METABOLIC PANEL
AST: 27 U/L (ref 0–37)
BUN: 17 mg/dL (ref 6–23)
CO2: 28 mEq/L (ref 19–32)
Calcium: 9 mg/dL (ref 8.4–10.5)
Creatinine, Ser: 1.12 mg/dL (ref 0.50–1.35)
GFR calc Af Amer: 67 mL/min — ABNORMAL LOW (ref >90)
GFR calc non Af Amer: 58 mL/min — ABNORMAL LOW (ref >90)
Glucose, Bld: 90 mg/dL (ref 70–99)

## 2013-10-17 LAB — CBC WITH DIFFERENTIAL/PLATELET
Basophils Absolute: 0.1 10*3/uL (ref 0.0–0.1)
Basophils Relative: 2 % — ABNORMAL HIGH (ref 0–1)
Eosinophils Relative: 7 % — ABNORMAL HIGH (ref 0–5)
HCT: 39.3 % (ref 39.0–52.0)
Lymphocytes Relative: 30 % (ref 12–46)
Lymphs Abs: 1.9 10*3/uL (ref 0.7–4.0)
MCHC: 33.1 g/dL (ref 30.0–36.0)
MCV: 94.2 fL (ref 78.0–100.0)
Monocytes Absolute: 0.5 10*3/uL (ref 0.1–1.0)
RBC: 4.17 MIL/uL — ABNORMAL LOW (ref 4.22–5.81)
RDW: 13 % (ref 11.5–15.5)
WBC: 6.1 10*3/uL (ref 4.0–10.5)

## 2013-10-17 LAB — PROTIME-INR
INR: 1.02 (ref 0.00–1.49)
Prothrombin Time: 13.2 seconds (ref 11.6–15.2)

## 2013-10-17 LAB — URINALYSIS, ROUTINE W REFLEX MICROSCOPIC
Glucose, UA: NEGATIVE mg/dL
Ketones, ur: NEGATIVE mg/dL
Protein, ur: >300 mg/dL — AB

## 2013-10-17 LAB — URINE MICROSCOPIC-ADD ON

## 2013-10-17 NOTE — ED Provider Notes (Signed)
Patient developed painless hematuria today. He is asymptomatic. Patient hemodynamically stable. Hemoglobin is normal. We have arranged for outpatient followup tomorrow with [facility]  Doug Sou, MD 10/17/13 2201

## 2013-10-17 NOTE — ED Notes (Signed)
Pt. reports hematuria " clots" onset this afternoon , denies dysuria or injury.

## 2013-10-17 NOTE — ED Provider Notes (Signed)
CSN: [id number]     Arrival date & time 10/17/13  1902 History   First MD Initiated Contact with Patient 10/17/13 2100     Chief Complaint  Patient presents with  . Hematuria   HPI  History provided by the patient and family. Patient is an 79 year old male with history of hypertension, hypercholesterolemia, COPD, CAD status post CABG who presents with complaints of painless hematuria. Patient reports first having blood in the urine this afternoon. He reports first having a blood clot but as soon as it comes out he has good flow his urine with some continued blood. Since that time he has had 8-9 episodes of hematuria this afternoon and evening. He denies any abdominal, back or flank pains. Denies any testicle or penile pain. He does report having slight blood in the urine in the past but never like today. Patient does take baby aspirin daily. Denies any trauma or injuries. Denies any fever, chills or sweats. No other aggravating or alleviating factors. No other associated symptoms.    Past Medical History  Diagnosis Date  . ASHD (arteriosclerotic heart disease)   . COPD (chronic obstructive pulmonary disease)   . GERD (gastroesophageal reflux disease)   . BPH (benign prostatic hyperplasia)   . Stroke 01/1999    "mild" (11/16/2012)  . Hypertension   . Hypercholesteremia   . Exertional dyspnea   . S/P angioplasty with stent, to high grade Lt external iliac atery  11/16/12 11/17/2012  . Hematoma of groin, at cath site 11/17/2012  . Acute blood loss anemia, lt groin hematoma 11/17/2012  . CAD (coronary artery disease)   . S/P CABG (coronary artery bypass graft) 2006  . PAD (peripheral artery disease)    Past Surgical History  Procedure Laterality Date  . Cholecystectomy open    . Iliac artery stent  11/16/2012    95% focal stenosis in L external iliac artery, stented w/ a 78mmx3cm Cordis Smart Nitinol self-expanding stent, resulting in reduction of a 95% stenosis to 0% residual.  .  Colectomy  ~ 2006    "partial" (11/16/2012)  . Lea doppler  12/17/2012    Normal  . Cardiovascular stress test  10/15/2012    Lexiscan. Basal to Mid Inferolateral bowel artifact. No Lexiscan EKG changes, non-diagnostic for ischemia. No reversible ischemia.  . Transthoracic echocardiogram  04/12/2013    EF 55-60%, LA moderately dilated,   . Femoral-femoral bypass graft     No family history on file. History  Substance Use Topics  . Smoking status: Former Smoker -- 2.00 packs/day for 30 years    Types: Cigarettes  . Smokeless tobacco: Never Used     Comment: 11/16/2012 "quit smoking cigarettes in the 1970's"  . Alcohol Use: 3.0 oz/week    5 Glasses of wine per week     Comment: 11/16/2012 "wine cooler 4-5 nights/wk"    Review of Systems  Constitutional: Negative for fever, chills, diaphoresis and fatigue.  Respiratory: Negative for shortness of breath.   Gastrointestinal: Negative for nausea, vomiting, abdominal pain, diarrhea and constipation.  Genitourinary: Positive for frequency and hematuria. Negative for dysuria, flank pain, genital sores, penile pain and testicular pain.  Musculoskeletal: Negative for back pain.  All other systems reviewed and are negative.    Allergies  Review of patient's allergies indicates no known allergies.  Home Medications   Current Outpatient Rx  Name  Route  Sig  Dispense  Refill  . aspirin 81 MG tablet   Oral   Take 81 mg  by mouth daily.         . finasteride (PROSCAR) 5 MG tablet   Oral   Take 5 mg by mouth daily.         Marland Kitchen HYDROcodone-acetaminophen (NORCO/VICODIN) 5-325 MG per tablet   Oral   Take 1 tablet by mouth every 6 (six) hours as needed for pain.   30 tablet   0   . isosorbide mononitrate (IMDUR) 30 MG 24 hr tablet      TAKE 1 TABLET DAILY   90 tablet   0   . metoprolol (LOPRESSOR) 50 MG tablet      TAKE 1 TABLET TWICE A DAY   180 tablet   0   . omeprazole (PRILOSEC) 20 MG capsule      TAKE 1 CAPSULE  DAILY   90 capsule   2   . simvastatin (ZOCOR) 80 MG tablet      TAKE ONE-HALF (1/2) TABLET ONCE A DAY   45 tablet   1   . terazosin (HYTRIN) 1 MG capsule      TAKE 1 CAPSULE TWICE A DAY   180 capsule   2    BP 164/53  Pulse 63  Temp(Src) 97.7 F (36.5 C) (Oral)  Resp 14  Wt 172 lb (78.019 kg)  BMI 26.16 kg/m2  SpO2 98% Physical Exam  Nursing note and vitals reviewed. Constitutional: He is oriented to person, place, and time. He appears well-developed and well-nourished. No distress.  HENT:  Head: Normocephalic.  Cardiovascular: Normal rate and regular rhythm.   Pulmonary/Chest: Effort normal and breath sounds normal. No respiratory distress. He has no wheezes.  Abdominal: Soft.  Genitourinary: Testes normal and penis normal. Rectal exam shows no mass and no tenderness. Prostate is enlarged. Prostate is not tender. Right testis shows no mass and no tenderness. Left testis shows no mass and no tenderness. Uncircumcised. No penile tenderness.  Prostate mildly enlarged without mass or tenderness.  Musculoskeletal: Normal range of motion.  Neurological: He is alert and oriented to person, place, and time.  Skin: Skin is warm. No rash noted.  No rash petechiae or bruising  Psychiatric: He has a normal mood and affect. His behavior is normal.    ED Course  Procedures   DIAGNOSTIC STUDIES: Oxygen Saturation is 98% on room air.    COORDINATION OF CARE:  Nursing notes reviewed. Vital signs reviewed. Initial pt interview and examination performed.   9:11 PM-patient seen and evaluated. Patient appears well in no acute distress. No pains or discomforts. Discussed work up plan with pt at bedside, which includes lab testing and possible imaging. Pt agrees with plan.   Pt discussed with attending physician.  Will plan to consult urology.  9:45PM Spoke with Dr. Wilson Singer with urology. Recommends urine culture and stopping aspirin. He would like patient to followup in urology  clinic by calling the office tomorrow.   Results for orders placed during the hospital encounter of 10/17/13  URINALYSIS, ROUTINE W REFLEX MICROSCOPIC      Result Value Range   Color, Urine RED (*) YELLOW   APPearance CLOUDY (*) CLEAR   Specific Gravity, Urine 1.018  1.005 - 1.030   pH 6.0  5.0 - 8.0   Glucose, UA NEGATIVE  NEGATIVE mg/dL   Hgb urine dipstick LARGE (*) NEGATIVE   Bilirubin Urine NEGATIVE  NEGATIVE   Ketones, ur NEGATIVE  NEGATIVE mg/dL   Protein, ur >161 (*) NEGATIVE mg/dL   Urobilinogen, UA 1.0  0.0 -  1.0 mg/dL   Nitrite NEGATIVE  NEGATIVE   Leukocytes, UA SMALL (*) NEGATIVE  CBC WITH DIFFERENTIAL      Result Value Range   WBC 6.1  4.0 - 10.5 K/uL   RBC 4.17 (*) 4.22 - 5.81 MIL/uL   Hemoglobin 13.0  13.0 - 17.0 g/dL   HCT 16.1  09.6 - 04.5 %   MCV 94.2  78.0 - 100.0 fL   MCH 31.2  26.0 - 34.0 pg   MCHC 33.1  30.0 - 36.0 g/dL   RDW 40.9  81.1 - 91.4 %   Platelets 129 (*) 150 - 400 K/uL   Neutrophils Relative % 52  43 - 77 %   Neutro Abs 3.2  1.7 - 7.7 K/uL   Lymphocytes Relative 30  12 - 46 %   Lymphs Abs 1.9  0.7 - 4.0 K/uL   Monocytes Relative 8  3 - 12 %   Monocytes Absolute 0.5  0.1 - 1.0 K/uL   Eosinophils Relative 7 (*) 0 - 5 %   Eosinophils Absolute 0.4  0.0 - 0.7 K/uL   Basophils Relative 2 (*) 0 - 1 %   Basophils Absolute 0.1  0.0 - 0.1 K/uL  COMPREHENSIVE METABOLIC PANEL      Result Value Range   Sodium 135  135 - 145 mEq/L   Potassium 4.2  3.5 - 5.1 mEq/L   Chloride 100  96 - 112 mEq/L   CO2 28  19 - 32 mEq/L   Glucose, Bld 90  70 - 99 mg/dL   BUN 17  6 - 23 mg/dL   Creatinine, Ser 7.82  0.50 - 1.35 mg/dL   Calcium 9.0  8.4 - 95.6 mg/dL   Total Protein 6.5  6.0 - 8.3 g/dL   Albumin 3.5  3.5 - 5.2 g/dL   AST 27  0 - 37 U/L   ALT 14  0 - 53 U/L   Alkaline Phosphatase 104  39 - 117 U/L   Total Bilirubin 0.5  0.3 - 1.2 mg/dL   GFR calc non Af Amer 58 (*) >90 mL/min   GFR calc Af Amer 67 (*) >90 mL/min  PROTIME-INR      Result Value  Range   Prothrombin Time 13.2  11.6 - 15.2 seconds   INR 1.02  0.00 - 1.49  URINE MICROSCOPIC-ADD ON      Result Value Range   Squamous Epithelial / LPF RARE  RARE   WBC, UA 3-6  <3 WBC/hpf   RBC / HPF TOO NUMEROUS TO COUNT  <3 RBC/hpf   Bacteria, UA FEW (*) RARE   Urine-Other URINALYSIS PERFORMED ON SUPERNATANT         MDM   1. Hematuria        Angus Seller, PA-C 10/17/13 2152

## 2013-10-17 NOTE — ED Notes (Signed)
Pt discharged.Vital signs stable and GCS 15.Discharge instruction given. 

## 2013-10-17 NOTE — ED Provider Notes (Signed)
Medical screening examination/treatment/procedure(s) were conducted as a shared visit with non-physician practitioner(s) and myself.  I personally evaluated the patient during the encounter.  EKG Interpretation   None        Doug Sou, MD 10/17/13 2239

## 2013-10-19 ENCOUNTER — Encounter (HOSPITAL_COMMUNITY): Payer: Medicare Other

## 2013-10-19 LAB — URINE CULTURE
Colony Count: NO GROWTH
Culture: NO GROWTH

## 2013-10-24 ENCOUNTER — Other Ambulatory Visit: Payer: Self-pay | Admitting: Urology

## 2013-10-28 ENCOUNTER — Encounter (HOSPITAL_COMMUNITY): Payer: Self-pay | Admitting: Pharmacy Technician

## 2013-10-30 ENCOUNTER — Other Ambulatory Visit: Payer: Self-pay | Admitting: Family Medicine

## 2013-10-31 ENCOUNTER — Other Ambulatory Visit (HOSPITAL_COMMUNITY): Payer: Self-pay | Admitting: Urology

## 2013-10-31 NOTE — Progress Notes (Signed)
cmet 10-14-13 epic Cbc with dif 10-17-13 epic abnoraml will repeat with pre op 10-31-13 10-10-13 ekg epic lov dr berry cardiology 10-10-13 epic 04-12-13 echo epic

## 2013-10-31 NOTE — Patient Instructions (Addendum)
22 Julian Hale  10/31/2013   Your procedure is scheduled on: Friday November 14th  Report to [facility] at 530 AM.  Call this number if you have problems the morning of surgery [phone]   Remember:   Do not eat food or drink liquids :After Midnight.     Take these medicines the morning of surgery with A SIP OF WATER: Metoprolol, Isosorbide, Omeprazole                                SEE [place] PREPARING FOR SURGERY SHEET             You may not have any metal on your body including hair pins and piercings  Do not wear jewelry, make-up.  Do not wear lotions, powders, or perfumes. You may wear deodorant.   Men may shave face and neck.  Do not bring valuables to the hospital. [facility] IS NOT RESPONSIBLE FOR VALUEABLES.  Contacts, dentures or bridgework may not be worn into surgery.  Leave suitcase in the car. After surgery it may be brought to your room.  For patients admitted to the hospital, checkout time is 11:00 AM the day of discharge.   Patients discharged the day of surgery will not be allowed to drive home.  Name and phone number of your driver:  Special Instructions: N/A   Please read over the following fact sheets that you were given:   Call Merleen Nicely RN pre op nurse if needed [phone]    FAILURE TO FOLLOW THESE INSTRUCTIONS MAY RESULT IN THE CANCELLATION OF YOUR SURGERY.  PATIENT SIGNATURE___________________________________________  NURSE SIGNATURE_____________________________________________

## 2013-11-01 ENCOUNTER — Encounter (HOSPITAL_COMMUNITY): Payer: Self-pay

## 2013-11-01 ENCOUNTER — Ambulatory Visit (HOSPITAL_COMMUNITY)
Admission: RE | Admit: 2013-11-01 | Discharge: 2013-11-01 | Disposition: A | Discharge status: Home / Self Care | Payer: PRIVATE HEALTH INSURANCE | Source: Ambulatory Visit | Attending: Urology | Admitting: Urology

## 2013-11-01 ENCOUNTER — Encounter (HOSPITAL_COMMUNITY)
Admission: RE | Admit: 2013-11-01 | Discharge: 2013-11-01 | Disposition: A | Discharge status: Home / Self Care | Payer: PRIVATE HEALTH INSURANCE | Source: Ambulatory Visit | Attending: Urology | Admitting: Urology

## 2013-11-01 DIAGNOSIS — Z87891 Personal history of nicotine dependence: Secondary | ICD-10-CM | POA: Insufficient documentation

## 2013-11-01 DIAGNOSIS — D494 Neoplasm of unspecified behavior of bladder: Secondary | ICD-10-CM | POA: Insufficient documentation

## 2013-11-01 DIAGNOSIS — Z951 Presence of aortocoronary bypass graft: Secondary | ICD-10-CM | POA: Insufficient documentation

## 2013-11-01 DIAGNOSIS — Z01812 Encounter for preprocedural laboratory examination: Secondary | ICD-10-CM | POA: Insufficient documentation

## 2013-11-01 LAB — CBC
HCT: 30.4 % — ABNORMAL LOW (ref 39.0–52.0)
Hemoglobin: 10 g/dL — ABNORMAL LOW (ref 13.0–17.0)
MCH: 30.5 pg (ref 26.0–34.0)
MCHC: 32.9 g/dL (ref 30.0–36.0)
MCV: 92.7 fL (ref 78.0–100.0)
Platelets: 167 10*3/uL (ref 150–400)
RBC: 3.28 MIL/uL — ABNORMAL LOW (ref 4.22–5.81)
RDW: 13 % (ref 11.5–15.5)
WBC: 6.7 10*3/uL (ref 4.0–10.5)

## 2013-11-02 ENCOUNTER — Telehealth (HOSPITAL_COMMUNITY): Payer: Self-pay | Admitting: *Deleted

## 2013-11-02 ENCOUNTER — Encounter (HOSPITAL_COMMUNITY): Payer: Medicare Other

## 2013-11-03 NOTE — H&P (Signed)
History of Present Illness    LUTS: When seen initially in 5/14 he was experiencing a feeling of incomplete emptying and decreased force of stream with nocturia x5. He was taking Proscar and Hytrin 1 mg at that time.  I have tried Myrbetriq and Vesicare however he developed urinary retention. He is considering PTNS.    Interval history: He began to experience gross, painless hematuria. He was on aspirin and this was stopped. His urine has nearly cleared up.    Past Medical History Problems  1. History of Colon Cancer (V10.05) 2. History of cardiac disorder (V12.50) 3. History of esophageal reflux (V12.79) 4. History of hypertension (V12.59) 5. History of Stroke syndrome (436)  Surgical History Problems  1. History of Heart Surgery 2. History of Leg Repair  Current Meds 1. Finasteride 5 MG Oral Tablet;  Therapy: 11Nov2013 to Recorded 2. Hydrocodone-Acetaminophen 5-325 MG Oral Tablet; AS NEEDED;  Therapy: 22Apr2014 to Recorded 3. Isosorbide Mononitrate ER 30 MG Oral Tablet Extended Release 24 Hour;  Therapy: 29Jan2014 to Recorded 4. Metoprolol Tartrate 50 MG Oral Tablet;  Therapy: 29Jan2014 to Recorded 5. Myrbetriq 25 MG Oral Tablet Extended Release 24 Hour; Take 1 tablet daily;  Therapy: 12May2014 to (Evaluate:26May2014); Last Rx:12May2014 Ordered 6. Omeprazole 20 MG Oral Capsule Delayed Release;  Therapy: 29Jan2014 to Recorded 7. Simvastatin 80 MG Oral Tablet;  Therapy: 19Aug2013 to Recorded 8. Terazosin HCl - 1 MG Oral Capsule;  Therapy: 29Jan2014 to Recorded  Allergies Medication  1. No Known Drug Allergies  Family History Problems  1. Family history of Congestive Heart Failure : Mother 2. Family history of Death In The Family Father : Father   71 3. Family history of Death In The Family Mother : Mother   age 45 4. Family history of Stroke Syndrome (V17.1) : Father  Social History Problems  1. Alcohol Use   3-4 weekly 2. Caffeine Use   1 per day 3.  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 Occupation: Retired  Event organiser, constitutional, skin, eye, otolaryngeal, hematologic/lymphatic, cardiovascular, pulmonary, endocrine, musculoskeletal, gastrointestinal, neurological and psychiatric system(s) were reviewed and pertinent findings if present are noted.  Genitourinary: hematuria and bladder pain.   Vitals Vital Signs  Height: 5 ft 7 in Weight: 172 lb  BMI Calculated: 26.94 BSA Calculated: 1.9 Blood Pressure: 124 / 49 Temperature: 97 F Heart Rate: 63  Physical Exam Constitutional: Well nourished and well developed . No acute distress.  ENT:. The ears and nose are normal in appearance.  Neck: The appearance of the neck is normal and no neck mass is present.  Pulmonary: No respiratory distress and normal respiratory rhythm and effort.  Cardiovascular: Heart rate and rhythm are normal . No peripheral edema.  Abdomen: The abdomen is soft and nontender. No masses are palpated. No CVA tenderness. No hernias are palpable. No hepatosplenomegaly noted.  Rectal: Rectal exam demonstrates normal sphincter tone, no tenderness and no masses. Estimated prostate size is 1+. The prostate has no nodularity and is not tender. The left seminal vesicle is nonpalpable. The right seminal vesicle is nonpalpable. The perineum is normal on inspection.  Genitourinary: Examination of the penis demonstrates no discharge, no masses, no lesions and a normal meatus. The scrotum is without lesions. The right epididymis is palpably normal and non-tender. The left epididymis is palpably normal and non-tender. The right testis is non-tender and without masses. The left testis is non-tender and without masses.  Lymphatics: The  femoral and inguinal nodes are not enlarged or tender.  Skin: Normal skin turgor, no visible rash and no visible skin lesions.  Neuro/Psych:. Mood and affect are appropriate.  Assessment Assessed   1. Bladder tumor (239.4) 2. Gross hematuria (599.71)  I went over the results of his CT scan with him which has revealed what appears to be bilateral renal cysts of no clinical significance. There were no filling defects in the collecting system or ureters. There did appear to be a filling defect in the bladder.  I went over my cystoscopic findings today which have revealed a bladder mass very possibly consistent with transitional cell carcinoma. We discussed the fact that currently there is no evidence of extravesical extension or pelvic adenopathy based on CT scan findings. Further characterization of the lesion is required for grading and staging purposes. We discussed proceeding with evaluation using transurethral resection of the lesion. I have discussed the procedure in detail as well as the potential risks and complications associated with this form of surgery. We also discussed the probability of successful resection of the intravesical portion of this lesion. I have recommended, as long as there is no contraindication at the time of surgery, the placement of intravesical mitomycin-C in order to reduce the risk of recurrence. We did discuss the potential side effects of this form of intravesical chemotherapy. The procedure will be performed under anesthesia as an outpatient.    Plan  1. He will remain off his aspirin.  2. He will be scheduled for transurethral resection of the bladder tumor.  I left his catheter out.

## 2013-11-04 ENCOUNTER — Ambulatory Visit (HOSPITAL_COMMUNITY): Payer: Medicare Other | Admitting: Anesthesiology

## 2013-11-04 ENCOUNTER — Encounter (HOSPITAL_COMMUNITY)
Admission: RE | Disposition: A | Discharge status: Home / Self Care | Payer: Self-pay | Source: Ambulatory Visit | Attending: Urology

## 2013-11-04 ENCOUNTER — Encounter (HOSPITAL_COMMUNITY): Payer: Medicare Other | Admitting: Anesthesiology

## 2013-11-04 ENCOUNTER — Encounter (HOSPITAL_COMMUNITY): Payer: Self-pay | Admitting: *Deleted

## 2013-11-04 ENCOUNTER — Ambulatory Visit (HOSPITAL_COMMUNITY)
Admission: RE | Admit: 2013-11-04 | Discharge: 2013-11-04 | Disposition: A | Discharge status: Home / Self Care | Payer: Medicare Other | Source: Ambulatory Visit | Attending: Urology | Admitting: Urology

## 2013-11-04 DIAGNOSIS — I10 Essential (primary) hypertension: Secondary | ICD-10-CM | POA: Insufficient documentation

## 2013-11-04 DIAGNOSIS — D494 Neoplasm of unspecified behavior of bladder: Secondary | ICD-10-CM

## 2013-11-04 DIAGNOSIS — K219 Gastro-esophageal reflux disease without esophagitis: Secondary | ICD-10-CM | POA: Insufficient documentation

## 2013-11-04 DIAGNOSIS — Z85038 Personal history of other malignant neoplasm of large intestine: Secondary | ICD-10-CM | POA: Insufficient documentation

## 2013-11-04 DIAGNOSIS — I2510 Atherosclerotic heart disease of native coronary artery without angina pectoris: Secondary | ICD-10-CM | POA: Insufficient documentation

## 2013-11-04 DIAGNOSIS — Z951 Presence of aortocoronary bypass graft: Secondary | ICD-10-CM | POA: Insufficient documentation

## 2013-11-04 DIAGNOSIS — R310 Gross hematuria: Secondary | ICD-10-CM | POA: Insufficient documentation

## 2013-11-04 DIAGNOSIS — Z79899 Other long term (current) drug therapy: Secondary | ICD-10-CM | POA: Insufficient documentation

## 2013-11-04 DIAGNOSIS — Z87891 Personal history of nicotine dependence: Secondary | ICD-10-CM | POA: Insufficient documentation

## 2013-11-04 DIAGNOSIS — C679 Malignant neoplasm of bladder, unspecified: Secondary | ICD-10-CM | POA: Insufficient documentation

## 2013-11-04 DIAGNOSIS — Z8673 Personal history of transient ischemic attack (TIA), and cerebral infarction without residual deficits: Secondary | ICD-10-CM | POA: Insufficient documentation

## 2013-11-04 HISTORY — PX: TRANSURETHRAL RESECTION OF BLADDER TUMOR WITH GYRUS (TURBT-GYRUS): SHX6458

## 2013-11-04 SURGERY — TRANSURETHRAL RESECTION OF BLADDER TUMOR WITH GYRUS (TURBT-GYRUS)
Anesthesia: General

## 2013-11-04 MED ORDER — LIDOCAINE HCL (CARDIAC) 20 MG/ML IV SOLN
INTRAVENOUS | Status: DC | PRN
  Administered 2013-11-04: 50 mg via INTRAVENOUS

## 2013-11-04 MED ORDER — PHENYLEPHRINE HCL 10 MG/ML IJ SOLN
INTRAMUSCULAR | Status: DC | PRN
  Administered 2013-11-04: 80 ug via INTRAVENOUS

## 2013-11-04 MED ORDER — HYDROCODONE-ACETAMINOPHEN 7.5-325 MG PO TABS: 1.0000 | ORAL_TABLET | ORAL | Status: DC | PRN

## 2013-11-04 MED ORDER — PROMETHAZINE HCL 25 MG/ML IJ SOLN: 6.2500 mg | INTRAMUSCULAR | Status: DC | PRN

## 2013-11-04 MED ORDER — ONDANSETRON HCL 4 MG/2ML IJ SOLN
INTRAMUSCULAR | Status: DC | PRN
  Administered 2013-11-04: 4 mg via INTRAVENOUS

## 2013-11-04 MED ORDER — LACTATED RINGERS IV SOLN
INTRAVENOUS | Status: DC | PRN
  Administered 2013-11-04: 07:00:00 via INTRAVENOUS

## 2013-11-04 MED ORDER — SODIUM CHLORIDE 0.9 % IR SOLN
Status: DC | PRN
  Administered 2013-11-04: 6000 mL via INTRAVESICAL

## 2013-11-04 MED ORDER — LACTATED RINGERS IV SOLN
INTRAVENOUS | Status: DC
  Administered 2013-11-04: 09:00:00 via INTRAVENOUS

## 2013-11-04 MED ORDER — FENTANYL CITRATE 0.05 MG/ML IJ SOLN
25.0000 ug | INTRAMUSCULAR | Status: DC | PRN
  Administered 2013-11-04 (×2): 50 ug via INTRAVENOUS

## 2013-11-04 MED ORDER — PHENAZOPYRIDINE HCL 200 MG PO TABS
200.0000 mg | ORAL_TABLET | Freq: Once | ORAL | Status: AC
  Administered 2013-11-04: 200 mg via ORAL

## 2013-11-04 MED ORDER — EPHEDRINE SULFATE 50 MG/ML IJ SOLN
INTRAMUSCULAR | Status: DC | PRN
  Administered 2013-11-04: 10 mg via INTRAVENOUS

## 2013-11-04 MED ORDER — FENTANYL CITRATE 0.05 MG/ML IJ SOLN
INTRAMUSCULAR | Status: AC
  Filled 2013-11-04: qty 2

## 2013-11-04 MED ORDER — FENTANYL CITRATE 0.05 MG/ML IJ SOLN
INTRAMUSCULAR | Status: DC | PRN
  Administered 2013-11-04 (×2): 50 ug via INTRAVENOUS

## 2013-11-04 MED ORDER — LIDOCAINE HCL 2 % EX GEL
CUTANEOUS | Status: AC
  Filled 2013-11-04: qty 10

## 2013-11-04 MED ORDER — PROPOFOL 10 MG/ML IV BOLUS
INTRAVENOUS | Status: DC | PRN
  Administered 2013-11-04: 140 mg via INTRAVENOUS

## 2013-11-04 MED ORDER — CIPROFLOXACIN IN D5W 400 MG/200ML IV SOLN
400.0000 mg | INTRAVENOUS | Status: AC
  Administered 2013-11-04: 400 mg via INTRAVENOUS

## 2013-11-04 MED ORDER — TAMSULOSIN HCL 0.4 MG PO CAPS
0.4000 mg | ORAL_CAPSULE | Freq: Once | ORAL | Status: AC
  Administered 2013-11-04: 0.4 mg via ORAL
  Filled 2013-11-04: qty 1

## 2013-11-04 MED ORDER — PHENAZOPYRIDINE HCL 100 MG PO TABS: 100.0000 mg | ORAL_TABLET | Freq: Three times a day (TID) | ORAL | Status: DC | PRN

## 2013-11-04 MED ORDER — MEPERIDINE HCL 50 MG/ML IJ SOLN: 6.2500 mg | INTRAMUSCULAR | Status: DC | PRN

## 2013-11-04 MED ORDER — CIPROFLOXACIN IN D5W 400 MG/200ML IV SOLN
INTRAVENOUS | Status: AC
  Filled 2013-11-04: qty 200

## 2013-11-04 MED ORDER — PHENAZOPYRIDINE HCL 200 MG PO TABS
ORAL_TABLET | ORAL | Status: AC
  Filled 2013-11-04: qty 1

## 2013-11-04 SURGICAL SUPPLY — 16 items
BAG URINE DRAINAGE (UROLOGICAL SUPPLIES) ×2 IMPLANT
BAG URO CATCHER STRL LF (DRAPE) ×2 IMPLANT
CATH FOLEY 2WAY SLVR  5CC 20FR (CATHETERS) ×1
CATH FOLEY 2WAY SLVR 5CC 20FR (CATHETERS) ×1 IMPLANT
DRAPE CAMERA CLOSED 9X96 (DRAPES) ×2 IMPLANT
ELECT LOOP MED HF 24F 12D (CUTTING LOOP) ×2 IMPLANT
EVACUATOR MICROVAS BLADDER (UROLOGICAL SUPPLIES) ×2 IMPLANT
GLOVE BIOGEL M 8.0 STRL (GLOVE) ×2 IMPLANT
GOWN STRL REIN XL XLG (GOWN DISPOSABLE) ×2 IMPLANT
HOLDER FOLEY CATH W/STRAP (MISCELLANEOUS) IMPLANT
KIT ASPIRATION TUBING (SET/KITS/TRAYS/PACK) ×2 IMPLANT
MANIFOLD NEPTUNE II (INSTRUMENTS) ×2 IMPLANT
NS IRRIG 1000ML POUR BTL (IV SOLUTION) ×2 IMPLANT
PACK CYSTO (CUSTOM PROCEDURE TRAY) ×2 IMPLANT
SYRINGE IRR TOOMEY STRL 70CC (SYRINGE) IMPLANT
TUBING CONNECTING 10 (TUBING) ×2 IMPLANT

## 2013-11-04 NOTE — Anesthesia Postprocedure Evaluation (Signed)
  Anesthesia Post-op Note  Patient: Julian Hale  Procedure(s) Performed: Procedure(s) (LRB): TRANSURETHRAL RESECTION OF BLADDER TUMOR WITH GYRUS (TURBT-GYRUS) (N/A)  Patient Location: PACU  Anesthesia Type: General  Level of Consciousness: awake and alert   Airway and Oxygen Therapy: Patient Spontanous Breathing  Post-op Pain: mild  Post-op Assessment: Post-op Vital signs reviewed, Patient's Cardiovascular Status Stable, Respiratory Function Stable, Patent Airway and No signs of Nausea or vomiting  Last Vitals:  Filed Vitals:   11/04/13 1015  BP: 146/52  Pulse: 62  Temp: 36.7 C  Resp: 18    Post-op Vital Signs: stable   Complications: No apparent anesthesia complications

## 2013-11-04 NOTE — Interval H&P Note (Signed)
History and Physical Interval Note:  11/04/2013 7:06 AM  Julian Hale  has presented today for surgery, with the diagnosis of BLADDER TUMOR   The various methods of treatment have been discussed with the patient and family. After consideration of risks, benefits and other options for treatment, the patient has consented to  Procedure(s): TRANSURETHRAL RESECTION OF BLADDER TUMOR WITH GYRUS (TURBT-GYRUS) (N/A) as a surgical intervention .  The patient's history has been reviewed, patient examined, no change in status, stable for surgery.  I have reviewed the patient's chart and labs.  Questions were answered to the patient's satisfaction.     Garnett Farm

## 2013-11-04 NOTE — Transfer of Care (Signed)
Immediate Anesthesia Transfer of Care Note  Patient: Julian Hale  Procedure(s) Performed: Procedure(s): TRANSURETHRAL RESECTION OF BLADDER TUMOR WITH GYRUS (TURBT-GYRUS) (N/A)  Patient Location: PACU  Anesthesia Type:General  Level of Consciousness: awake and alert   Airway & Oxygen Therapy: Patient Spontanous Breathing and Patient connected to face mask oxygen  Post-op Assessment: Report given to PACU RN and Post -op Vital signs reviewed and stable  Post vital signs: Reviewed and stable  Complications: No apparent anesthesia complications

## 2013-11-04 NOTE — Anesthesia Preprocedure Evaluation (Addendum)
Anesthesia Evaluation  Patient identified by MRN, date of birth, ID band Patient awake    Reviewed: Allergy & Precautions, H&P , NPO status , Patient's Chart, lab work & pertinent test results  Airway Mallampati: II TM Distance: >3 FB Neck ROM: Full    Dental no notable dental hx. (+) Edentulous Upper and Edentulous Lower   Pulmonary COPDformer smoker,  breath sounds clear to auscultation  Pulmonary exam normal       Cardiovascular hypertension, Pt. on medications Angina: 2006. + CAD, + CABG and + Peripheral Vascular Disease Rhythm:Regular Rate:Normal     Neuro/Psych CVA, No Residual Symptoms negative psych ROS   GI/Hepatic negative GI ROS, Neg liver ROS,   Endo/Other  negative endocrine ROS  Renal/GU negative Renal ROS  negative genitourinary   Musculoskeletal negative musculoskeletal ROS (+)   Abdominal   Peds negative pediatric ROS (+)  Hematology negative hematology ROS (+)   Anesthesia Other Findings   Reproductive/Obstetrics negative OB ROS                          Anesthesia Physical Anesthesia Plan  ASA: III  Anesthesia Plan: General   Post-op Pain Management:    Induction: Intravenous  Airway Management Planned: LMA  Additional Equipment:   Intra-op Plan:   Post-operative Plan: Extubation in OR  Informed Consent: I have reviewed the patients History and Physical, chart, labs and discussed the procedure including the risks, benefits and alternatives for the proposed anesthesia with the patient or authorized representative who has indicated his/her understanding and acceptance.   Dental advisory given  Plan Discussed with: CRNA  Anesthesia Plan Comments:         Anesthesia Quick Evaluation

## 2013-11-04 NOTE — Op Note (Signed)
PATIENT:  Julian Hale  PRE-OPERATIVE DIAGNOSIS: Bladder tumor  POST-OPERATIVE DIAGNOSIS: Same  PROCEDURE:  Procedure(s): TRANSURETHRAL RESECTION OF BLADDER TUMOR (TURBT) (3cm.)  SURGEON:  Surgeon(s): Garnett Farm  ANESTHESIA:   General  EBL:  Minimal  DRAINS: None  SPECIMEN:  Source of Specimen:  Bladder tumor  DISPOSITION OF SPECIMEN:  PATHOLOGY  Indication: Mr. Doubleday is an 79 year old male who experienced gross, painless hematuria on aspirin. Upper tract evaluation with CT scan revealed no urothelial abnormalities. Multiple cysts were identified. None of these were felt to have contributed to his gross hematuria however there was an asymmetric filling defect associated with the right wall of the bladder which had some enhancement and cystoscopically was felt to represent a sessile malignant bladder tumor. He is therefore brought to the operating room today for transurethral resectional biopsy of the lesion.  Description of operation: The patient was taken to the operating room and administered general anesthesia. He was then placed on the table and moved to the dorsal lithotomy position after which his genitalia was sterilely prepped and draped. An official timeout was then performed.  The 26 French resectoscope with Timberlake obturator was then introduced into the bladder and the obturator was removed. The resectoscope element with 12 lens was then inserted and the bladder was fully and systematically inspected. Ureteral orifices were noted to be in the normal anatomic positions. The bladder was noted to have 3+ trabeculation. The tumor was identified on the right wall of the bladder. It was away from the right ureteral orifice and involved the right wall extending up toward the dome and extending posteriorly as well.  I first began by resecting the obvious tumor. As I continued to resect I did not identify typical detrusor fibers but rather what appeared to be some areas  of necrotic tumor. There seemed to be some fixation of the bladder wall in some locations as well. I resected away all of the obvious tumor and then into the tumor in order to hopefully identified muscle fibers for staging purposes. I then fulgurated all bleeding points. Reinspection of the bladder revealed all obvious tumor had been fully resected and there was no evidence of perforation. The Microvasive evacuator was then used to irrigate the bladder and remove all of the portions of bladder tumor which were sent to pathology. The bladder was then again inspected and found to be free of any obvious bleeding. I then removed the resectoscope.  Do to what appeared to be clinically muscle invasive cancer I did not elect to instill mitomycin-C. I did perform a bimanual examination after draining the bladder and was unable to palpate any definite mass on the right-hand side.  PLAN OF CARE: Discharge to home after PACU  PATIENT DISPOSITION:  PACU - hemodynamically stable.

## 2013-11-04 NOTE — Progress Notes (Signed)
Dr. Acey Lav made aware of patient's heart rates being in the mid 40s- blood pressures stable- O.K. To go to Short Stay.

## 2013-11-07 ENCOUNTER — Encounter (HOSPITAL_COMMUNITY): Payer: Self-pay | Admitting: Urology

## 2014-01-29 ENCOUNTER — Other Ambulatory Visit: Payer: Self-pay | Admitting: Family Medicine

## 2014-01-31 ENCOUNTER — Telehealth: Payer: Self-pay | Admitting: Family Medicine

## 2014-01-31 MED ORDER — FINASTERIDE 5 MG PO TABS: 5.0000 mg | ORAL_TABLET | Freq: Every day | ORAL | Status: DC

## 2014-01-31 NOTE — Telephone Encounter (Signed)
Pt has med check Monday 2/16 and needs refill Fenasteride through [facility].

## 2014-02-06 ENCOUNTER — Encounter: Payer: Self-pay | Admitting: Family Medicine

## 2014-02-06 ENCOUNTER — Ambulatory Visit (INDEPENDENT_AMBULATORY_CARE_PROVIDER_SITE_OTHER): Payer: Medicare Other | Admitting: Family Medicine

## 2014-02-06 VITALS — BP 142/88 | HR 56 | Wt 166.0 lb

## 2014-02-06 DIAGNOSIS — C679 Malignant neoplasm of bladder, unspecified: Secondary | ICD-10-CM

## 2014-02-06 DIAGNOSIS — K219 Gastro-esophageal reflux disease without esophagitis: Secondary | ICD-10-CM

## 2014-02-06 DIAGNOSIS — E785 Hyperlipidemia, unspecified: Secondary | ICD-10-CM

## 2014-02-06 DIAGNOSIS — J310 Chronic rhinitis: Secondary | ICD-10-CM

## 2014-02-06 DIAGNOSIS — N400 Enlarged prostate without lower urinary tract symptoms: Secondary | ICD-10-CM

## 2014-02-06 DIAGNOSIS — I2510 Atherosclerotic heart disease of native coronary artery without angina pectoris: Secondary | ICD-10-CM

## 2014-02-06 LAB — LIPID PANEL
CHOL/HDL RATIO: 3.1 ratio
CHOLESTEROL: 148 mg/dL (ref 0–200)
HDL: 48 mg/dL (ref >39)
LDL CALC: 75 mg/dL (ref 0–99)
Triglycerides: 123 mg/dL (ref <150)
VLDL: 25 mg/dL (ref 0–40)

## 2014-02-06 MED ORDER — SIMVASTATIN 80 MG PO TABS: ORAL_TABLET | ORAL | Status: DC

## 2014-02-06 MED ORDER — METOPROLOL TARTRATE 50 MG PO TABS: ORAL_TABLET | ORAL | Status: DC

## 2014-02-06 MED ORDER — ISOSORBIDE MONONITRATE ER 30 MG PO TB24: 30.0000 mg | ORAL_TABLET | Freq: Every morning | ORAL | Status: DC

## 2014-02-06 MED ORDER — OMEPRAZOLE 20 MG PO CPDR: DELAYED_RELEASE_CAPSULE | ORAL | Status: DC

## 2014-02-06 MED ORDER — TERAZOSIN HCL 1 MG PO CAPS: ORAL_CAPSULE | ORAL | Status: DC

## 2014-02-06 MED ORDER — AZELASTINE HCL 0.1 % NA SOLN: 2.0000 | Freq: Two times a day (BID) | NASAL | Status: DC

## 2014-02-06 NOTE — Progress Notes (Signed)
   Subjective:    Patient ID: Julian Hale, male    DOB: [birth year], 80 y.o.   MRN: [id number]  HPI He is here for medication check. He was recently diagnosed with bladder cancer. He has elected to treat this conservatively and avoid radiation as well as chemotherapy. He is being followed by his urologist. He continues on multiple medications for his prostate and bladder related issues. He also takes Zocor and is having no difficulty with that. His main complaint today is when he knows when he eats. He has tried antihistamines without much success and is also tried Flonase again with no success. His had no chest pain, shortness of breath. He has not had any difficulty with leg pains or claudication type symptoms.   Review of Systems     Objective:   Physical Exam alert and in no distress. Tympanic membranes and canals are normal. Throat is clear. Tonsils are normal. Neck is supple without adenopathy or thyromegaly. Cardiac exam shows a regular sinus rhythm without murmurs or gallops. Lungs are clear to auscultation.        Assessment & Plan:  Gustatory rhinitis - Plan: azelastine (ASTELIN) 137 MCG/SPRAY nasal spray  Bladder cancer  Dyslipidemia - Plan: simvastatin (ZOCOR) 80 MG tablet, Lipid panel  GERD (gastroesophageal reflux disease) - Plan: omeprazole (PRILOSEC) 20 MG capsule  BPH (benign prostatic hyperplasia) - Plan: terazosin (HYTRIN) 1 MG capsule  ASHD (arteriosclerotic heart disease) - Plan: metoprolol (LOPRESSOR) 50 MG tablet, isosorbide mononitrate (IMDUR) 30 MG 24 hr tablet  his medications were reviewed and renewed. I will see if Astelin will help with his gustatory rhinitis.

## 2014-08-03 ENCOUNTER — Encounter: Payer: Self-pay | Admitting: Family Medicine

## 2014-08-03 ENCOUNTER — Ambulatory Visit (INDEPENDENT_AMBULATORY_CARE_PROVIDER_SITE_OTHER): Payer: Medicare Other | Admitting: Family Medicine

## 2014-08-03 VITALS — BP 110/70 | HR 64 | Wt 156.0 lb

## 2014-08-03 DIAGNOSIS — Z23 Encounter for immunization: Secondary | ICD-10-CM

## 2014-08-03 DIAGNOSIS — Z8719 Personal history of other diseases of the digestive system: Secondary | ICD-10-CM

## 2014-08-03 NOTE — Progress Notes (Signed)
   Subjective:    Patient ID: Julian Hale, male    DOB: 12/03/1926, 80 y.o.   MRN: [id number]  HPI He complains of difficulty swallowing especially solid foods. He can take in liquids without any difficulty. This has been bothering him for several months and is slowly getting worse. He has a previous history of esophageal stricture with dilatation.    Review of Systems     Objective:   Physical Exam Alert and in no distress. Cardiac exam shows regular rhythm without murmurs or gallops. Lungs are clear to auscultation. Abdominal exam shows questionable midepigastric fullness. Review of record indicates he has not had a pneumococcal vaccine.       Assessment & Plan:  History of esophageal stricture - Plan: Ambulatory referral to Gastroenterology  Need for prophylactic vaccination against Streptococcus pneumoniae (pneumococcus) - Plan: Pneumococcal conjugate vaccine 13-valent

## 2014-09-01 ENCOUNTER — Other Ambulatory Visit: Payer: Self-pay | Admitting: Gastroenterology

## 2014-09-01 DIAGNOSIS — R1310 Dysphagia, unspecified: Secondary | ICD-10-CM

## 2014-09-06 ENCOUNTER — Ambulatory Visit
Admission: RE | Admit: 2014-09-06 | Discharge: 2014-09-06 | Disposition: A | Discharge status: Home / Self Care | Payer: Medicare Other | Source: Ambulatory Visit | Attending: Gastroenterology | Admitting: Gastroenterology

## 2014-09-06 DIAGNOSIS — R1310 Dysphagia, unspecified: Secondary | ICD-10-CM

## 2014-09-08 ENCOUNTER — Telehealth: Payer: Self-pay | Admitting: Family Medicine

## 2014-09-08 HISTORY — PX: UPPER GI ENDOSCOPY: SHX6162

## 2014-09-08 NOTE — Telephone Encounter (Signed)
Pt's daughter came in and stated that father needed a new parking placard. I am sending one back to be completed. Please mail to daughter's address [phone]. [phone].

## 2014-09-09 ENCOUNTER — Encounter (HOSPITAL_COMMUNITY): Payer: Self-pay | Admitting: Emergency Medicine

## 2014-09-09 ENCOUNTER — Emergency Department (HOSPITAL_COMMUNITY)
Admission: EM | Admit: 2014-09-09 | Discharge: 2014-09-09 | Disposition: A | Discharge status: Home / Self Care | Payer: Medicare Other | Attending: Emergency Medicine | Admitting: Emergency Medicine

## 2014-09-09 ENCOUNTER — Emergency Department (HOSPITAL_COMMUNITY): Payer: Medicare Other

## 2014-09-09 DIAGNOSIS — K219 Gastro-esophageal reflux disease without esophagitis: Secondary | ICD-10-CM | POA: Diagnosis not present

## 2014-09-09 DIAGNOSIS — Z9889 Other specified postprocedural states: Secondary | ICD-10-CM | POA: Insufficient documentation

## 2014-09-09 DIAGNOSIS — Z9861 Coronary angioplasty status: Secondary | ICD-10-CM | POA: Diagnosis not present

## 2014-09-09 DIAGNOSIS — E7800 Pure hypercholesterolemia, unspecified: Secondary | ICD-10-CM | POA: Insufficient documentation

## 2014-09-09 DIAGNOSIS — J4489 Other specified chronic obstructive pulmonary disease: Secondary | ICD-10-CM | POA: Insufficient documentation

## 2014-09-09 DIAGNOSIS — N329 Bladder disorder, unspecified: Secondary | ICD-10-CM | POA: Insufficient documentation

## 2014-09-09 DIAGNOSIS — Z79899 Other long term (current) drug therapy: Secondary | ICD-10-CM | POA: Insufficient documentation

## 2014-09-09 DIAGNOSIS — N3289 Other specified disorders of bladder: Secondary | ICD-10-CM

## 2014-09-09 DIAGNOSIS — Z87828 Personal history of other (healed) physical injury and trauma: Secondary | ICD-10-CM | POA: Diagnosis not present

## 2014-09-09 DIAGNOSIS — Z862 Personal history of diseases of the blood and blood-forming organs and certain disorders involving the immune mechanism: Secondary | ICD-10-CM | POA: Insufficient documentation

## 2014-09-09 DIAGNOSIS — J449 Chronic obstructive pulmonary disease, unspecified: Secondary | ICD-10-CM | POA: Diagnosis not present

## 2014-09-09 DIAGNOSIS — I2510 Atherosclerotic heart disease of native coronary artery without angina pectoris: Secondary | ICD-10-CM | POA: Diagnosis not present

## 2014-09-09 DIAGNOSIS — Z87891 Personal history of nicotine dependence: Secondary | ICD-10-CM | POA: Insufficient documentation

## 2014-09-09 DIAGNOSIS — N400 Enlarged prostate without lower urinary tract symptoms: Secondary | ICD-10-CM | POA: Insufficient documentation

## 2014-09-09 DIAGNOSIS — Z8673 Personal history of transient ischemic attack (TIA), and cerebral infarction without residual deficits: Secondary | ICD-10-CM | POA: Insufficient documentation

## 2014-09-09 DIAGNOSIS — R319 Hematuria, unspecified: Secondary | ICD-10-CM | POA: Insufficient documentation

## 2014-09-09 DIAGNOSIS — Z7982 Long term (current) use of aspirin: Secondary | ICD-10-CM | POA: Insufficient documentation

## 2014-09-09 DIAGNOSIS — I10 Essential (primary) hypertension: Secondary | ICD-10-CM | POA: Diagnosis not present

## 2014-09-09 LAB — URINALYSIS, ROUTINE W REFLEX MICROSCOPIC
BILIRUBIN URINE: NEGATIVE
Glucose, UA: NEGATIVE mg/dL
KETONES UR: NEGATIVE mg/dL
NITRITE: NEGATIVE
Protein, ur: >300 mg/dL — AB
Specific Gravity, Urine: 1.021 (ref 1.005–1.030)
UROBILINOGEN UA: 0.2 mg/dL (ref 0.0–1.0)
pH: 6.5 (ref 5.0–8.0)

## 2014-09-09 LAB — COMPREHENSIVE METABOLIC PANEL
ALK PHOS: 109 U/L (ref 39–117)
ALT: 6 U/L (ref 0–53)
ANION GAP: 12 (ref 5–15)
AST: 12 U/L (ref 0–37)
Albumin: 3.2 g/dL — ABNORMAL LOW (ref 3.5–5.2)
BUN: 16 mg/dL (ref 6–23)
CHLORIDE: 103 meq/L (ref 96–112)
CO2: 24 mEq/L (ref 19–32)
Calcium: 9.2 mg/dL (ref 8.4–10.5)
Creatinine, Ser: 1.11 mg/dL (ref 0.50–1.35)
GFR calc non Af Amer: 58 mL/min — ABNORMAL LOW (ref >90)
GFR, EST AFRICAN AMERICAN: 67 mL/min — AB (ref >90)
GLUCOSE: 117 mg/dL — AB (ref 70–99)
POTASSIUM: 4.2 meq/L (ref 3.7–5.3)
Sodium: 139 mEq/L (ref 137–147)
Total Bilirubin: 0.4 mg/dL (ref 0.3–1.2)
Total Protein: 6.8 g/dL (ref 6.0–8.3)

## 2014-09-09 LAB — CBC WITH DIFFERENTIAL/PLATELET
BASOS ABS: 0.1 10*3/uL (ref 0.0–0.1)
BASOS PCT: 1 % (ref 0–1)
EOS ABS: 0.3 10*3/uL (ref 0.0–0.7)
Eosinophils Relative: 4 % (ref 0–5)
HCT: 33.9 % — ABNORMAL LOW (ref 39.0–52.0)
Hemoglobin: 10.6 g/dL — ABNORMAL LOW (ref 13.0–17.0)
Lymphocytes Relative: 19 % (ref 12–46)
Lymphs Abs: 1.3 10*3/uL (ref 0.7–4.0)
MCH: 28.3 pg (ref 26.0–34.0)
MCHC: 31.3 g/dL (ref 30.0–36.0)
MCV: 90.4 fL (ref 78.0–100.0)
MONOS PCT: 8 % (ref 3–12)
Monocytes Absolute: 0.5 10*3/uL (ref 0.1–1.0)
NEUTROS ABS: 4.8 10*3/uL (ref 1.7–7.7)
NEUTROS PCT: 68 % (ref 43–77)
PLATELETS: 170 10*3/uL (ref 150–400)
RBC: 3.75 MIL/uL — ABNORMAL LOW (ref 4.22–5.81)
RDW: 13.9 % (ref 11.5–15.5)
WBC: 7 10*3/uL (ref 4.0–10.5)

## 2014-09-09 LAB — URINE MICROSCOPIC-ADD ON

## 2014-09-09 MED ORDER — IOHEXOL 300 MG/ML  SOLN
50.0000 mL | Freq: Once | INTRAMUSCULAR | Status: AC | PRN
  Administered 2014-09-09: 50 mL via ORAL

## 2014-09-09 MED ORDER — IOHEXOL 300 MG/ML  SOLN
100.0000 mL | Freq: Once | INTRAMUSCULAR | Status: AC | PRN
  Administered 2014-09-09: 100 mL via INTRAVENOUS

## 2014-09-09 MED ORDER — SODIUM CHLORIDE 0.9 % IV BOLUS (SEPSIS)
500.0000 mL | Freq: Once | INTRAVENOUS | Status: AC
  Administered 2014-09-09: 500 mL via INTRAVENOUS

## 2014-09-09 MED ORDER — HYDROCODONE-ACETAMINOPHEN 5-325 MG PO TABS: 2.0000 | ORAL_TABLET | ORAL | Status: DC | PRN

## 2014-09-09 NOTE — ED Notes (Signed)
Pt's visitor coming to nurses station "demanding" he be given something for pain.  "It's been over an hour and no one has given him any pain medication! He doesn't even have any IV!"  I assured the visitor that I knew personally that he had an IV.  Visitor states "Well there's nothing going through it!"  I explained that the doctor had ordered a 500 mL bolus that had finished and he did not need any more fluid.  I asked the patient how bad his pain was on a scale from 0-10 and where his pain was.  Pt states "oh, i'm not in any pain."

## 2014-09-09 NOTE — ED Notes (Signed)
Pt reports hematuria since yesterday afternoon. Reports pain with urination. Reports bleeding even when not urinating. Hx of bladder cancer, not being treated at present.

## 2014-09-09 NOTE — ED Notes (Signed)
Pt transported to CT ?

## 2014-09-09 NOTE — ED Notes (Addendum)
Pt reports hematuria x1 day with hx of bladder cancer with no treatment.  Pt reports burning on urination with large amounts of blood and also pts family states "he is leaking from the bladder even without urination." Pt is weak and is having trouble walking as stated by family. Pt had duodenoscopy to "stretch throat" yesterday.

## 2014-09-09 NOTE — ED Provider Notes (Signed)
CSN: [id number]     Arrival date & time 09/09/14  1443 History   First MD Initiated Contact with Patient 09/09/14 [phone]     Chief Complaint  Patient presents with  . Hematuria      HPI Patient presents with gross hematuria since yesterday.  Some bleeding when he urinates.  Has history of bladder cancer and had resection done last year.  Patient declined any kind of chemotherapy.  Patient denies syncope, fever, chills, nausea, vomiting.  Does have some generalized weakness. Past Medical History  Diagnosis Date  . ASHD (arteriosclerotic heart disease)   . COPD (chronic obstructive pulmonary disease)   . GERD (gastroesophageal reflux disease)   . BPH (benign prostatic hyperplasia)   . Stroke 01/1999    "mild" (11/16/2012)  . Hypertension   . Hypercholesteremia   . Exertional dyspnea   . S/P angioplasty with stent, to high grade Lt external iliac atery  11/16/12 11/17/2012  . Hematoma of groin, at cath site 11/17/2012  . Acute blood loss anemia, lt groin hematoma 11/17/2012  . CAD (coronary artery disease)   . S/P CABG (coronary artery bypass graft) 2006  . PAD (peripheral artery disease)    Past Surgical History  Procedure Laterality Date  . Cholecystectomy open    . Iliac artery stent  11/16/2012    95% focal stenosis in L external iliac artery, stented w/ a 34mmx3cm Cordis Smart Nitinol self-expanding stent, resulting in reduction of a 95% stenosis to 0% residual.  . Colectomy  ~ 2006    "partial" (11/16/2012)  . Lea doppler  12/17/2012    Normal  . Cardiovascular stress test  10/15/2012    Lexiscan. Basal to Mid Inferolateral bowel artifact. No Lexiscan EKG changes, non-diagnostic for ischemia. No reversible ischemia.  . Transthoracic echocardiogram  04/12/2013    EF 55-60%, LA moderately dilated,   . Femoral-femoral bypass graft    . Eye surgery      bilateral cataracts  . Hernia repair      left  . Transurethral resection of bladder tumor with gyrus (turbt-gyrus) N/A  11/04/2013    Procedure: TRANSURETHRAL RESECTION OF BLADDER TUMOR WITH GYRUS (TURBT-GYRUS);  Surgeon: Claybon Jabs, MD;  Location: [facility];  Service: Urology;  Laterality: N/A;   History reviewed. No pertinent family history. History  Substance Use Topics  . Smoking status: Former Smoker -- 2.00 packs/day for 30 years    Types: Cigarettes    Quit date: 07/31/1984  . Smokeless tobacco: Never Used     Comment: 11/16/2012 "quit smoking cigarettes in the 1970's"  . Alcohol Use: 3.0 oz/week    5 Glasses of wine per week     Comment: 11/16/2012 "wine cooler 4-5 nights/wk"    Review of Systems  All other systems reviewed and are negative  Allergies  Review of patient's allergies indicates no known allergies.  Home Medications   Prior to Admission medications   Medication Sig Start Date End Date Taking? Authorizing Provider  aspirin EC 81 MG tablet Take 81 mg by mouth daily.   Yes Historical Provider, MD  finasteride (PROSCAR) 5 MG tablet Take 1 tablet (5 mg total) by mouth daily. 01/31/14  Yes Denita Lung, MD  isosorbide mononitrate (IMDUR) 30 MG 24 hr tablet Take 1 tablet (30 mg total) by mouth every morning. 02/06/14  Yes Denita Lung, MD  metoprolol (LOPRESSOR) 50 MG tablet Take 50 mg by mouth 2 (two) times daily.   Yes Historical Provider, MD  omeprazole (PRILOSEC) 20 MG capsule Take 20 mg by mouth daily.   Yes Historical Provider, MD  simvastatin (ZOCOR) 80 MG tablet Take 40 mg by mouth daily.   Yes Historical Provider, MD  solifenacin (VESICARE) 10 MG tablet Take 10 mg by mouth daily.   Yes Historical Provider, MD  terazosin (HYTRIN) 1 MG capsule Take 1 mg by mouth 2 (two) times daily.   Yes Historical Provider, MD   BP 131/49  Pulse 59  Temp(Src) 97.7 F (36.5 C) (Oral)  Resp 20  SpO2 100% Physical Exam  Nursing note and vitals reviewed. Constitutional: He is oriented to person, place, and time. He appears well-developed and well-nourished. No distress.  HENT:  Head:  Normocephalic and atraumatic.  Eyes: Pupils are equal, round, and reactive to light.  Neck: Normal range of motion.  Cardiovascular: Normal rate and intact distal pulses.   Pulmonary/Chest: No respiratory distress.  Abdominal: Normal appearance. He exhibits no distension. There is no tenderness. There is no rebound and no guarding.  Musculoskeletal: Normal range of motion.  Neurological: He is alert and oriented to person, place, and time. No cranial nerve deficit.  Skin: Skin is warm and dry. No rash noted.  Psychiatric: He has a normal mood and affect. His behavior is normal.    ED Course  Procedures (including critical care time) Labs Review Labs Reviewed  URINALYSIS, ROUTINE W REFLEX MICROSCOPIC - Abnormal; Notable for the following:    Color, Urine RED (*)    APPearance TURBID (*)    Hgb urine dipstick LARGE (*)    Protein, ur >300 (*)    Leukocytes, UA SMALL (*)    All other components within normal limits  CBC WITH DIFFERENTIAL - Abnormal; Notable for the following:    RBC 3.75 (*)    Hemoglobin 10.6 (*)    HCT 33.9 (*)    All other components within normal limits  COMPREHENSIVE METABOLIC PANEL - Abnormal; Notable for the following:    Glucose, Bld 117 (*)    Albumin 3.2 (*)    GFR calc non Af Amer 58 (*)    GFR calc Af Amer 67 (*)    All other components within normal limits  URINE MICROSCOPIC-ADD ON    Imaging Review Ct Abdomen Pelvis W Contrast  09/09/2014   CLINICAL DATA:  Hematuria.  Dysuria.  History of bladder cancer.  EXAM: CT ABDOMEN AND PELVIS WITH CONTRAST  TECHNIQUE: Multidetector CT imaging of the abdomen and pelvis was performed using the standard protocol following bolus administration of intravenous contrast.  CONTRAST:  62mL OMNIPAQUE IOHEXOL 300 MG/ML SOLN, 176mL OMNIPAQUE IOHEXOL 300 MG/ML SOLN  COMPARISON:  10/21/2013  FINDINGS: Lower chest: Moderate-sized hiatal hernia with 1.0 cm in short axis lymph node or small fluid collection adjacent to the  hiatal hernia on the right side.  Hepatobiliary: Prior cholecystectomy.  Otherwise negative.  Spleen: Intact  Pancreas: Intact  Stomach/Bowel: Scattered colonic diverticula. No dilated bowel. Terminal ileum unremarkable. Appendix absent.  Adrenals/urinary tract: There is an 8.2 x 4.1 cm high density filling defect to the right side of the urinary bladder compatible with tumor or blood clot. This has heterogeneous internal density. Tumor is favored. The hematuria protocol was not utilized. No compelling evidence of other enhancement along the urothelium. Bilateral renal cysts are present; a variety of the tiny renal hypodense lesions are technically too small to characterize.  Vascular/Lymphatic: Aortoiliac atherosclerotic vascular disease. Chronic occlusion of the right common iliac artery and right external and  internal iliac arteries. A fem-fem bypass is patent. I do not see definite pathologic pelvic adenopathy.  Reproductive: Prostate gland and seminal vesicles not well seen.  Musculoskeletal: Old right pubic ramus fractures, healed. I do not observe definite osseous metastatic disease. Bony demineralization. Superior endplate compression fractures at L3 and L4 are similar to prior. Left inguinal hernia contain small bowel and adipose tissue, without findings of strangulation or obstruction.  Other: None    IMPRESSION:  1. Enlargement of the right bladder tumor, currently 8.2 x 4.1 cm. Some of this density could represent blood clot adjacent to the known tumor.  2. Moderate-sized hiatal hernia.  3. Remote superior endplate compression fractures at L3 and L4. 4. Left inguinal hernia contains adipose tissue and nondilated/non-strangulated small bowel.    Electronically Signed   By: Sherryl Barters M.D.   On: 09/09/2014 12:27     EKG Interpretation None     Patient was able to ambulate without assistance in the hallway.  I discussed with the daughter that his bladder tumor he is probably the cause of  his hematuria.  At this point in time his hemoglobin is stable and blood pressure is stable.  He is hemodynamically stable and is without pain.  There is no reason for hospitalization at this time.  He is stable for discharge. MDM   Final diagnoses:  Hematuria  Bladder mass        Dot Lanes, MD 09/09/14 1335

## 2014-09-09 NOTE — ED Notes (Signed)
Bladder scan performed, unable to detect any urine present after emptying bladder in urinal.

## 2014-09-09 NOTE — ED Notes (Signed)
Ambulated patient.  Pt ambulated to bathroom and back with no assistance from staff.  Steady gait.  Denies dizziness, lightheadedness, or weakness.

## 2014-09-09 NOTE — ED Notes (Signed)
Went in with Dr. Audie Pinto who discussed the plan to discharge home.  Visitor states, "you shouldn't have walked because they would have admitted you." Visitor states "well, I'll call an ambulance later." I informed her that she was certainly welcome to do that but we would not be admitting him today.

## 2014-09-09 NOTE — Discharge Instructions (Signed)

## 2014-09-09 NOTE — ED Notes (Signed)
Upon pt discharge, daughter states "This is absolutely ridiculous.  How do you expect me to take care of him at home.  I've got my own problems! He needs to be admitted."  I again explained admission criteria and that he did not meet this.  I explained the importance of follow up and explained that urology needs to be aware.  Daughter was angry that he was not having his bladder cancer scraped out today.  I explained the purpose of the emergency room is to rule out life threatening situations.  Pt's labwork was at baseline.  Pt's bladder cancer is larger than it was but that is to be expected because they are not treating his cancer.  I continued to be kind and professional to this woman and even asked the doctor to write a prescription for pain medication should he be in pain at home and even gave them a urinal to go home with.  Meagan, RN was assisting with pt's discharge and upon listening to daughter complain about not being able to take care of him at home, Carron Brazen suggested that she look into options for long term care.  Daughter states, "Are you kidding me?!?".  Meagan responded, "I am just trying to help you with your options".  Daughter was angry and told Meagan to "take off her happy pants".

## 2014-09-11 NOTE — Telephone Encounter (Signed)
IT LOOKS LIKE THIS WAS DONE

## 2014-09-11 NOTE — Telephone Encounter (Signed)
Take care of this 

## 2014-09-24 ENCOUNTER — Encounter (HOSPITAL_COMMUNITY)
Admission: EM | Disposition: A | Discharge status: Skilled Nursing Facility | Payer: Self-pay | Source: Home / Self Care | Attending: Urology

## 2014-09-24 ENCOUNTER — Observation Stay (HOSPITAL_COMMUNITY): Payer: Medicare Other | Admitting: Anesthesiology

## 2014-09-24 ENCOUNTER — Encounter (HOSPITAL_COMMUNITY): Payer: Self-pay | Admitting: Emergency Medicine

## 2014-09-24 ENCOUNTER — Inpatient Hospital Stay (HOSPITAL_COMMUNITY)
Admission: EM | Admit: 2014-09-24 | Discharge: 2014-09-27 | DRG: 669 | Disposition: A | Discharge status: Skilled Nursing Facility | Payer: Medicare Other | Attending: Urology | Admitting: Urology

## 2014-09-24 ENCOUNTER — Emergency Department (HOSPITAL_COMMUNITY): Payer: Medicare Other

## 2014-09-24 ENCOUNTER — Encounter (HOSPITAL_COMMUNITY): Payer: Medicare Other | Admitting: Anesthesiology

## 2014-09-24 DIAGNOSIS — D638 Anemia in other chronic diseases classified elsewhere: Secondary | ICD-10-CM | POA: Diagnosis present

## 2014-09-24 DIAGNOSIS — Z951 Presence of aortocoronary bypass graft: Secondary | ICD-10-CM

## 2014-09-24 DIAGNOSIS — Z8673 Personal history of transient ischemic attack (TIA), and cerebral infarction without residual deficits: Secondary | ICD-10-CM

## 2014-09-24 DIAGNOSIS — R310 Gross hematuria: Secondary | ICD-10-CM | POA: Diagnosis present

## 2014-09-24 DIAGNOSIS — C678 Malignant neoplasm of overlapping sites of bladder: Principal | ICD-10-CM | POA: Diagnosis present

## 2014-09-24 DIAGNOSIS — R319 Hematuria, unspecified: Secondary | ICD-10-CM

## 2014-09-24 DIAGNOSIS — R531 Weakness: Secondary | ICD-10-CM

## 2014-09-24 DIAGNOSIS — Z6822 Body mass index (BMI) 22.0-22.9, adult: Secondary | ICD-10-CM

## 2014-09-24 DIAGNOSIS — J309 Allergic rhinitis, unspecified: Secondary | ICD-10-CM

## 2014-09-24 DIAGNOSIS — D649 Anemia, unspecified: Secondary | ICD-10-CM

## 2014-09-24 DIAGNOSIS — I739 Peripheral vascular disease, unspecified: Secondary | ICD-10-CM

## 2014-09-24 DIAGNOSIS — N3289 Other specified disorders of bladder: Secondary | ICD-10-CM | POA: Diagnosis present

## 2014-09-24 DIAGNOSIS — K219 Gastro-esophageal reflux disease without esophagitis: Secondary | ICD-10-CM | POA: Diagnosis present

## 2014-09-24 DIAGNOSIS — K567 Ileus, unspecified: Secondary | ICD-10-CM | POA: Diagnosis not present

## 2014-09-24 DIAGNOSIS — Z9049 Acquired absence of other specified parts of digestive tract: Secondary | ICD-10-CM | POA: Diagnosis present

## 2014-09-24 DIAGNOSIS — I2510 Atherosclerotic heart disease of native coronary artery without angina pectoris: Secondary | ICD-10-CM

## 2014-09-24 DIAGNOSIS — J449 Chronic obstructive pulmonary disease, unspecified: Secondary | ICD-10-CM

## 2014-09-24 DIAGNOSIS — Z85038 Personal history of other malignant neoplasm of large intestine: Secondary | ICD-10-CM

## 2014-09-24 DIAGNOSIS — Z95820 Peripheral vascular angioplasty status with implants and grafts: Secondary | ICD-10-CM

## 2014-09-24 DIAGNOSIS — Z87891 Personal history of nicotine dependence: Secondary | ICD-10-CM

## 2014-09-24 DIAGNOSIS — D62 Acute posthemorrhagic anemia: Secondary | ICD-10-CM

## 2014-09-24 DIAGNOSIS — E46 Unspecified protein-calorie malnutrition: Secondary | ICD-10-CM | POA: Diagnosis present

## 2014-09-24 DIAGNOSIS — N400 Enlarged prostate without lower urinary tract symptoms: Secondary | ICD-10-CM

## 2014-09-24 DIAGNOSIS — E785 Hyperlipidemia, unspecified: Secondary | ICD-10-CM

## 2014-09-24 DIAGNOSIS — C679 Malignant neoplasm of bladder, unspecified: Secondary | ICD-10-CM

## 2014-09-24 DIAGNOSIS — I10 Essential (primary) hypertension: Secondary | ICD-10-CM | POA: Diagnosis present

## 2014-09-24 HISTORY — PX: CYSTOSCOPY: SHX5120

## 2014-09-24 HISTORY — PX: TRANSURETHRAL RESECTION OF BLADDER TUMOR: SHX2575

## 2014-09-24 LAB — CBC WITH DIFFERENTIAL/PLATELET
BASOS PCT: 1 % (ref 0–1)
Basophils Absolute: 0.1 10*3/uL (ref 0.0–0.1)
Eosinophils Absolute: 0.3 10*3/uL (ref 0.0–0.7)
Eosinophils Relative: 4 % (ref 0–5)
HEMATOCRIT: 27.6 % — AB (ref 39.0–52.0)
Hemoglobin: 9 g/dL — ABNORMAL LOW (ref 13.0–17.0)
LYMPHS ABS: 1.4 10*3/uL (ref 0.7–4.0)
LYMPHS PCT: 19 % (ref 12–46)
MCH: 28.8 pg (ref 26.0–34.0)
MCHC: 32.6 g/dL (ref 30.0–36.0)
MCV: 88.2 fL (ref 78.0–100.0)
MONOS PCT: 11 % (ref 3–12)
Monocytes Absolute: 0.8 10*3/uL (ref 0.1–1.0)
NEUTROS ABS: 5.2 10*3/uL (ref 1.7–7.7)
NEUTROS PCT: 65 % (ref 43–77)
Platelets: 195 10*3/uL (ref 150–400)
RBC: 3.13 MIL/uL — AB (ref 4.22–5.81)
RDW: 13.9 % (ref 11.5–15.5)
WBC: 7.8 10*3/uL (ref 4.0–10.5)

## 2014-09-24 LAB — I-STAT CHEM 8, ED
BUN: 12 mg/dL (ref 6–23)
CHLORIDE: 106 meq/L (ref 96–112)
CREATININE: 1.1 mg/dL (ref 0.50–1.35)
Calcium, Ion: 1.2 mmol/L (ref 1.13–1.30)
Glucose, Bld: 94 mg/dL (ref 70–99)
HCT: 27 % — ABNORMAL LOW (ref 39.0–52.0)
Hemoglobin: 9.2 g/dL — ABNORMAL LOW (ref 13.0–17.0)
POTASSIUM: 3.9 meq/L (ref 3.7–5.3)
Sodium: 139 mEq/L (ref 137–147)
TCO2: 21 mmol/L (ref 0–100)

## 2014-09-24 LAB — URINALYSIS, ROUTINE W REFLEX MICROSCOPIC
Bilirubin Urine: NEGATIVE
GLUCOSE, UA: NEGATIVE mg/dL
KETONES UR: NEGATIVE mg/dL
Nitrite: NEGATIVE
Protein, ur: >300 mg/dL — AB
SPECIFIC GRAVITY, URINE: 1.012 (ref 1.005–1.030)
Urobilinogen, UA: 1 mg/dL (ref 0.0–1.0)
pH: 7 (ref 5.0–8.0)

## 2014-09-24 LAB — URINE MICROSCOPIC-ADD ON

## 2014-09-24 SURGERY — TURBT (TRANSURETHRAL RESECTION OF BLADDER TUMOR)
Anesthesia: General | Site: Ureter | Laterality: Right

## 2014-09-24 MED ORDER — CEPHALEXIN 500 MG PO CAPS
500.0000 mg | ORAL_CAPSULE | Freq: Four times a day (QID) | ORAL | Status: DC
  Administered 2014-09-25 – 2014-09-26 (×4): 500 mg via ORAL
  Filled 2014-09-24 (×9): qty 1

## 2014-09-24 MED ORDER — PROMETHAZINE HCL 25 MG/ML IJ SOLN: 6.2500 mg | INTRAMUSCULAR | Status: DC | PRN

## 2014-09-24 MED ORDER — FINASTERIDE 5 MG PO TABS
5.0000 mg | ORAL_TABLET | Freq: Every day | ORAL | Status: DC
  Administered 2014-09-24 – 2014-09-27 (×4): 5 mg via ORAL
  Filled 2014-09-24 (×4): qty 1

## 2014-09-24 MED ORDER — HYDROCODONE-ACETAMINOPHEN 5-325 MG PO TABS: 1.0000 | ORAL_TABLET | ORAL | Status: DC | PRN

## 2014-09-24 MED ORDER — BACITRACIN-NEOMYCIN-POLYMYXIN 400-5-5000 EX OINT: 1.0000 "application " | TOPICAL_OINTMENT | Freq: Three times a day (TID) | CUTANEOUS | Status: DC | PRN

## 2014-09-24 MED ORDER — HYDROMORPHONE HCL 1 MG/ML IJ SOLN
0.2500 mg | INTRAMUSCULAR | Status: DC | PRN
  Administered 2014-09-24 (×2): 0.25 mg via INTRAVENOUS
  Administered 2014-09-24: 0.5 mg via INTRAVENOUS
  Administered 2014-09-24 (×4): 0.25 mg via INTRAVENOUS

## 2014-09-24 MED ORDER — FENTANYL CITRATE 0.05 MG/ML IJ SOLN
INTRAMUSCULAR | Status: AC
  Filled 2014-09-24: qty 2

## 2014-09-24 MED ORDER — SODIUM CHLORIDE 0.9 % IR SOLN
3000.0000 mL | Status: DC
  Administered 2014-09-24: 3000 mL

## 2014-09-24 MED ORDER — DARIFENACIN HYDROBROMIDE ER 15 MG PO TB24
15.0000 mg | ORAL_TABLET | Freq: Every day | ORAL | Status: DC
  Administered 2014-09-24 – 2014-09-27 (×4): 15 mg via ORAL
  Filled 2014-09-24 (×4): qty 1

## 2014-09-24 MED ORDER — ONDANSETRON HCL 4 MG/2ML IJ SOLN
INTRAMUSCULAR | Status: DC | PRN
  Administered 2014-09-24: 4 mg via INTRAVENOUS

## 2014-09-24 MED ORDER — METHYLENE BLUE 1 % INJ SOLN
INTRAMUSCULAR | Status: AC
  Filled 2014-09-24: qty 10

## 2014-09-24 MED ORDER — MORPHINE SULFATE 2 MG/ML IJ SOLN
2.0000 mg | Freq: Once | INTRAMUSCULAR | Status: AC
  Administered 2014-09-24: 2 mg via INTRAVENOUS
  Filled 2014-09-24: qty 1

## 2014-09-24 MED ORDER — ONDANSETRON HCL 4 MG/2ML IJ SOLN
INTRAMUSCULAR | Status: AC
  Filled 2014-09-24: qty 2

## 2014-09-24 MED ORDER — HYDROCODONE-ACETAMINOPHEN 5-325 MG PO TABS: 1.0000 | ORAL_TABLET | Freq: Four times a day (QID) | ORAL | Status: DC | PRN

## 2014-09-24 MED ORDER — ONDANSETRON HCL 4 MG/2ML IJ SOLN
4.0000 mg | INTRAMUSCULAR | Status: DC | PRN
  Administered 2014-09-24 – 2014-09-25 (×5): 4 mg via INTRAVENOUS
  Filled 2014-09-24 (×5): qty 2

## 2014-09-24 MED ORDER — CEPHALEXIN 500 MG PO CAPS: 500.0000 mg | ORAL_CAPSULE | Freq: Four times a day (QID) | ORAL | Status: DC

## 2014-09-24 MED ORDER — BELLADONNA ALKALOIDS-OPIUM 16.2-60 MG RE SUPP
RECTAL | Status: AC
  Filled 2014-09-24: qty 1

## 2014-09-24 MED ORDER — BELLADONNA ALKALOIDS-OPIUM 16.2-60 MG RE SUPP
RECTAL | Status: DC | PRN
Start: 2014-09-24 — End: 2014-09-24
  Administered 2014-09-24: 1 via RECTAL

## 2014-09-24 MED ORDER — LIDOCAINE HCL (CARDIAC) 20 MG/ML IV SOLN
INTRAVENOUS | Status: DC | PRN
  Administered 2014-09-24: 80 mg via INTRAVENOUS

## 2014-09-24 MED ORDER — ACETAMINOPHEN 325 MG PO TABS: 650.0000 mg | ORAL_TABLET | ORAL | Status: DC | PRN

## 2014-09-24 MED ORDER — IOHEXOL 300 MG/ML  SOLN
INTRAMUSCULAR | Status: DC | PRN
  Administered 2014-09-24: 5 mL via INTRAVENOUS

## 2014-09-24 MED ORDER — DOCUSATE SODIUM 100 MG PO CAPS
100.0000 mg | ORAL_CAPSULE | Freq: Two times a day (BID) | ORAL | Status: DC
  Administered 2014-09-24 – 2014-09-26 (×6): 100 mg via ORAL
  Filled 2014-09-24 (×8): qty 1

## 2014-09-24 MED ORDER — PHENYLEPHRINE HCL 10 MG/ML IJ SOLN
10.0000 mg | INTRAVENOUS | Status: DC | PRN
  Administered 2014-09-24: 50 ug/min via INTRAVENOUS

## 2014-09-24 MED ORDER — PROPOFOL 10 MG/ML IV BOLUS
INTRAVENOUS | Status: DC | PRN
  Administered 2014-09-24: 110 mg via INTRAVENOUS

## 2014-09-24 MED ORDER — CEFAZOLIN SODIUM-DEXTROSE 2-3 GM-% IV SOLR
INTRAVENOUS | Status: DC | PRN
  Administered 2014-09-24: 2 g via INTRAVENOUS

## 2014-09-24 MED ORDER — PHENYLEPHRINE HCL 10 MG/ML IJ SOLN
INTRAMUSCULAR | Status: DC | PRN
  Administered 2014-09-24 (×2): 40 ug via INTRAVENOUS

## 2014-09-24 MED ORDER — TERAZOSIN HCL 1 MG PO CAPS
1.0000 mg | ORAL_CAPSULE | Freq: Two times a day (BID) | ORAL | Status: DC
  Administered 2014-09-24 – 2014-09-27 (×7): 1 mg via ORAL
  Filled 2014-09-24 (×8): qty 1

## 2014-09-24 MED ORDER — CEFAZOLIN SODIUM-DEXTROSE 2-3 GM-% IV SOLR
INTRAVENOUS | Status: AC
  Filled 2014-09-24: qty 50

## 2014-09-24 MED ORDER — FENTANYL CITRATE 0.05 MG/ML IJ SOLN
INTRAMUSCULAR | Status: DC | PRN
  Administered 2014-09-24 (×4): 50 ug via INTRAVENOUS

## 2014-09-24 MED ORDER — SODIUM CHLORIDE 0.9 % IV SOLN
Freq: Once | INTRAVENOUS | Status: AC
  Administered 2014-09-24: 1000 mL via INTRAVENOUS

## 2014-09-24 MED ORDER — PANTOPRAZOLE SODIUM 40 MG PO TBEC
40.0000 mg | DELAYED_RELEASE_TABLET | Freq: Every day | ORAL | Status: DC
  Administered 2014-09-24 – 2014-09-27 (×4): 40 mg via ORAL
  Filled 2014-09-24 (×5): qty 1

## 2014-09-24 MED ORDER — LIDOCAINE HCL 2 % EX GEL
CUTANEOUS | Status: AC
  Filled 2014-09-24: qty 10

## 2014-09-24 MED ORDER — SENNA 8.6 MG PO TABS
1.0000 | ORAL_TABLET | Freq: Two times a day (BID) | ORAL | Status: DC
  Administered 2014-09-24 – 2014-09-27 (×7): 8.6 mg via ORAL
  Filled 2014-09-24 (×6): qty 1

## 2014-09-24 MED ORDER — ATORVASTATIN CALCIUM 40 MG PO TABS
40.0000 mg | ORAL_TABLET | Freq: Every day | ORAL | Status: DC
  Administered 2014-09-24 – 2014-09-26 (×3): 40 mg via ORAL
  Filled 2014-09-24 (×4): qty 1

## 2014-09-24 MED ORDER — BELLADONNA ALKALOIDS-OPIUM 16.2-60 MG RE SUPP
1.0000 | Freq: Three times a day (TID) | RECTAL | Status: DC | PRN
  Administered 2014-09-25: 1 via RECTAL
  Filled 2014-09-24: qty 1

## 2014-09-24 MED ORDER — ISOSORBIDE MONONITRATE ER 30 MG PO TB24
30.0000 mg | ORAL_TABLET | Freq: Every morning | ORAL | Status: DC
  Administered 2014-09-24 – 2014-09-25 (×2): 30 mg via ORAL
  Filled 2014-09-24 (×2): qty 1

## 2014-09-24 MED ORDER — PROPOFOL 10 MG/ML IV BOLUS
INTRAVENOUS | Status: AC
  Filled 2014-09-24: qty 20

## 2014-09-24 MED ORDER — METOPROLOL TARTRATE 50 MG PO TABS
50.0000 mg | ORAL_TABLET | Freq: Two times a day (BID) | ORAL | Status: DC
  Administered 2014-09-24 – 2014-09-25 (×2): 50 mg via ORAL
  Filled 2014-09-24 (×4): qty 1

## 2014-09-24 MED ORDER — FENTANYL CITRATE 0.05 MG/ML IJ SOLN
25.0000 ug | INTRAMUSCULAR | Status: DC | PRN
  Administered 2014-09-24 (×2): 50 ug via INTRAVENOUS

## 2014-09-24 MED ORDER — PHENYLEPHRINE 40 MCG/ML (10ML) SYRINGE FOR IV PUSH (FOR BLOOD PRESSURE SUPPORT)
PREFILLED_SYRINGE | INTRAVENOUS | Status: AC
  Filled 2014-09-24: qty 10

## 2014-09-24 MED ORDER — DEXTROSE IN LACTATED RINGERS 5 % IV SOLN
INTRAVENOUS | Status: AC
  Administered 2014-09-24 – 2014-09-26 (×4): via INTRAVENOUS

## 2014-09-24 MED ORDER — SODIUM CHLORIDE 0.9 % IR SOLN
Status: DC | PRN
  Administered 2014-09-24 (×6): 3000 mL via INTRAVESICAL

## 2014-09-24 MED ORDER — MORPHINE SULFATE 2 MG/ML IJ SOLN
2.0000 mg | INTRAMUSCULAR | Status: DC | PRN
  Administered 2014-09-24: 2 mg via INTRAVENOUS
  Administered 2014-09-24 (×2): 4 mg via INTRAVENOUS
  Filled 2014-09-24 (×2): qty 2
  Filled 2014-09-24: qty 1

## 2014-09-24 MED ORDER — HYDROCODONE-ACETAMINOPHEN 5-325 MG PO TABS
1.0000 | ORAL_TABLET | ORAL | Status: DC | PRN
  Administered 2014-09-25 (×2): 1 via ORAL
  Filled 2014-09-24 (×2): qty 1

## 2014-09-24 MED ORDER — PHENYLEPHRINE HCL 10 MG/ML IJ SOLN
INTRAMUSCULAR | Status: AC
  Filled 2014-09-24: qty 1

## 2014-09-24 MED ORDER — SODIUM CHLORIDE 0.9 % IV SOLN
INTRAVENOUS | Status: DC | PRN
  Administered 2014-09-24: 10:00:00 via INTRAVENOUS

## 2014-09-24 MED ORDER — HYDROMORPHONE HCL 1 MG/ML IJ SOLN
INTRAMUSCULAR | Status: AC
  Filled 2014-09-24: qty 1

## 2014-09-24 MED ORDER — CEFAZOLIN SODIUM-DEXTROSE 2-3 GM-% IV SOLR
2.0000 g | Freq: Three times a day (TID) | INTRAVENOUS | Status: AC
  Administered 2014-09-24 – 2014-09-25 (×2): 2 g via INTRAVENOUS
  Filled 2014-09-24 (×2): qty 50

## 2014-09-24 MED ORDER — LIDOCAINE HCL (CARDIAC) 20 MG/ML IV SOLN
INTRAVENOUS | Status: AC
  Filled 2014-09-24: qty 5

## 2014-09-24 SURGICAL SUPPLY — 21 items
BAG URINE DRAINAGE (UROLOGICAL SUPPLIES) ×3 IMPLANT
BAG URO CATCHER STRL LF (DRAPE) ×3 IMPLANT
CATH FOLEY 3WAY 30CC 22FR (CATHETERS) ×3 IMPLANT
CLOTH BEACON ORANGE TIMEOUT ST (SAFETY) ×3 IMPLANT
DRAPE CAMERA CLOSED 9X96 (DRAPES) ×3 IMPLANT
ELECT BUTTON HF 24-28F 2 30DE (ELECTRODE) ×6 IMPLANT
ELECT LOOP MED HF 24F 12D (CUTTING LOOP) ×3 IMPLANT
ELECT LOOP MED HF 24F 12D CBL (CLIP) ×6 IMPLANT
ELECT REM PT RETURN 9FT ADLT (ELECTROSURGICAL) ×3
ELECT RESECT VAPORIZE 12D CBL (ELECTRODE) IMPLANT
ELECTRODE REM PT RTRN 9FT ADLT (ELECTROSURGICAL) ×2 IMPLANT
GLOVE BIOGEL M STRL SZ7.5 (GLOVE) ×3 IMPLANT
GOWN STRL REUS W/TWL LRG LVL3 (GOWN DISPOSABLE) ×3 IMPLANT
GOWN STRL REUS W/TWL XL LVL3 (GOWN DISPOSABLE) ×3 IMPLANT
GUIDEWIRE STR DUAL SENSOR (WIRE) ×3 IMPLANT
KIT ASPIRATION TUBING (SET/KITS/TRAYS/PACK) IMPLANT
KIT BASIN OR (CUSTOM PROCEDURE TRAY) ×3 IMPLANT
MANIFOLD NEPTUNE II (INSTRUMENTS) ×3 IMPLANT
PACK CYSTO (CUSTOM PROCEDURE TRAY) ×3 IMPLANT
STENT CONTOUR 6FRX26X.038 (STENTS) ×3 IMPLANT
TUBING CONNECTING 10 (TUBING) ×3 IMPLANT

## 2014-09-24 NOTE — ED Provider Notes (Signed)
CSN: [id number]     Arrival date & time 09/24/14  0312 History   First MD Initiated Contact with Patient 09/24/14 0340     Chief Complaint  Patient presents with  . Hematuria     (Consider location/radiation/quality/duration/timing/severity/associated sxs/prior Treatment) HPI Comments: Patient with a bladder cancer who's had hematuria for a while was offered surgery by urology.  Patient refused.  Tonight.  He woke with dysuria and passing blood presents to the emergency department, stating, that he's changed his mind about surgery. He, states, that his appetite has been poor.  Denies any vomiting, or diarrhea, fever.  Patient is a 80 y.o. male presenting with hematuria. The history is provided by the patient.  Hematuria This is a recurrent problem. The current episode started yesterday. The problem occurs constantly. The problem has been unchanged. Pertinent negatives include no abdominal pain, chills, fatigue, fever, rash, vomiting or weakness. Associated symptoms comments: Dysuria. Exacerbated by: Urinating. He has tried nothing for the symptoms. The treatment provided no relief.    Past Medical History  Diagnosis Date  . ASHD (arteriosclerotic heart disease)   . COPD (chronic obstructive pulmonary disease)   . GERD (gastroesophageal reflux disease)   . BPH (benign prostatic hyperplasia)   . Stroke 01/1999    "mild" (11/16/2012)  . Hypertension   . Hypercholesteremia   . Exertional dyspnea   . S/P angioplasty with stent, to high grade Lt external iliac atery  11/16/12 11/17/2012  . Hematoma of groin, at cath site 11/17/2012  . Acute blood loss anemia, lt groin hematoma 11/17/2012  . CAD (coronary artery disease)   . S/P CABG (coronary artery bypass graft) 2006  . PAD (peripheral artery disease)    Past Surgical History  Procedure Laterality Date  . Cholecystectomy open    . Iliac artery stent  11/16/2012    95% focal stenosis in L external iliac artery, stented w/ a 65mmx3cm  Cordis Smart Nitinol self-expanding stent, resulting in reduction of a 95% stenosis to 0% residual.  . Colectomy  ~ 2006    "partial" (11/16/2012)  . Lea doppler  12/17/2012    Normal  . Cardiovascular stress test  10/15/2012    Lexiscan. Basal to Mid Inferolateral bowel artifact. No Lexiscan EKG changes, non-diagnostic for ischemia. No reversible ischemia.  . Transthoracic echocardiogram  04/12/2013    EF 55-60%, LA moderately dilated,   . Femoral-femoral bypass graft    . Eye surgery      bilateral cataracts  . Hernia repair      left  . Transurethral resection of bladder tumor with gyrus (turbt-gyrus) N/A 11/04/2013    Procedure: TRANSURETHRAL RESECTION OF BLADDER TUMOR WITH GYRUS (TURBT-GYRUS);  Surgeon: Claybon Jabs, MD;  Location: [facility];  Service: Urology;  Laterality: N/A;   History reviewed. No pertinent family history. History  Substance Use Topics  . Smoking status: Former Smoker -- 2.00 packs/day for 30 years    Types: Cigarettes    Quit date: 07/31/1984  . Smokeless tobacco: Never Used     Comment: 11/16/2012 "quit smoking cigarettes in the 1970's"  . Alcohol Use: 3.0 oz/week    5 Glasses of wine per week     Comment: 11/16/2012 "wine cooler 4-5 nights/wk"    Review of Systems  Constitutional: Negative for fever, chills and fatigue.  Respiratory: Negative for shortness of breath.   Gastrointestinal: Negative for vomiting, abdominal pain and diarrhea.  Genitourinary: Positive for hematuria. Negative for dysuria, frequency and flank pain.  Skin:  Negative for rash and wound.  Neurological: Negative for weakness.  All other systems reviewed and are negative.     Allergies  Review of patient's allergies indicates no known allergies.  Home Medications   Prior to Admission medications   Medication Sig Start Date End Date Taking? Authorizing Provider  finasteride (PROSCAR) 5 MG tablet Take 1 tablet (5 mg total) by mouth daily. 01/31/14  Yes Denita Lung, MD   HYDROcodone-acetaminophen (NORCO/VICODIN) 5-325 MG per tablet Take 1-2 tablets by mouth every 4 (four) hours as needed for moderate pain.   Yes Historical Provider, MD  isosorbide mononitrate (IMDUR) 30 MG 24 hr tablet Take 1 tablet (30 mg total) by mouth every morning. 02/06/14  Yes Denita Lung, MD  metoprolol (LOPRESSOR) 50 MG tablet Take 50 mg by mouth 2 (two) times daily.   Yes Historical Provider, MD  omeprazole (PRILOSEC) 20 MG capsule Take 20 mg by mouth daily.   Yes Historical Provider, MD  simvastatin (ZOCOR) 80 MG tablet Take 40 mg by mouth daily.   Yes Historical Provider, MD  solifenacin (VESICARE) 10 MG tablet Take 10 mg by mouth daily.   Yes Historical Provider, MD  terazosin (HYTRIN) 1 MG capsule Take 1 mg by mouth 2 (two) times daily.   Yes Historical Provider, MD  cephALEXin (KEFLEX) 500 MG capsule Take 1 capsule (500 mg total) by mouth 4 (four) times daily. 09/24/14   Milon Score, MD  HYDROcodone-acetaminophen (NORCO) 5-325 MG per tablet Take 1 tablet by mouth every 6 (six) hours as needed for moderate pain. 09/24/14   Milon Score, MD   BP 127/48  Pulse 77  Temp(Src) 97.8 F (36.6 C) (Oral)  Resp 20  Ht 5\' 10"  (1.778 m)  Wt 156 lb (70.76 kg)  BMI 22.38 kg/m2  SpO2 99% Physical Exam  Nursing note and vitals reviewed. Constitutional: He appears well-developed and well-nourished.  HENT:  Head: Normocephalic.  Eyes: Pupils are equal, round, and reactive to light.  Neck: Normal range of motion.  Cardiovascular: Normal rate and regular rhythm.   Pulmonary/Chest: Effort normal and breath sounds normal.  Abdominal: Soft. Bowel sounds are normal. He exhibits no distension. There is no tenderness.  Musculoskeletal: Normal range of motion.  Neurological: He is alert.  Skin: Skin is warm and dry.  Psychiatric: He has a normal mood and affect.    ED Course  Procedures (including critical care time) Labs Review Labs Reviewed  URINALYSIS, ROUTINE W REFLEX  MICROSCOPIC - Abnormal; Notable for the following:    Color, Urine RED (*)    APPearance TURBID (*)    Hgb urine dipstick LARGE (*)    Protein, ur >300 (*)    Leukocytes, UA TRACE (*)    All other components within normal limits  CBC WITH DIFFERENTIAL - Abnormal; Notable for the following:    RBC 3.13 (*)    Hemoglobin 9.0 (*)    HCT 27.6 (*)    All other components within normal limits  I-STAT CHEM 8, ED - Abnormal; Notable for the following:    Hemoglobin 9.2 (*)    HCT 27.0 (*)    All other components within normal limits  URINE MICROSCOPIC-ADD ON  HEMOGLOBIN AND HEMATOCRIT, BLOOD  BASIC METABOLIC PANEL    Imaging Review Ct Abdomen Pelvis Wo Contrast  09/24/2014   CLINICAL DATA:  Hematuria and pain with urination. Known bladder cancer, not treated.  EXAM: CT ABDOMEN AND PELVIS WITHOUT CONTRAST  TECHNIQUE: Multidetector CT imaging  of the abdomen and pelvis was performed following the standard protocol without IV contrast.  COMPARISON:  09/09/2014  FINDINGS: The lung bases are clear.  Moderate-sized esophageal hiatal hernia.  Surgical absence of the gallbladder. Unenhanced appearance of the liver, spleen, pancreas, adrenal glands, inferior vena cava, and retroperitoneal lymph nodes is unremarkable. Calcification of the abdominal aorta without aneurysm. Kidneys demonstrate multiple masses shown to represent cysts on prior studies. No hydronephrosis in either kidney. Stomach, small bowel, and colon are not abnormally distended. No free air or free fluid in the abdomen.  The bladder is incompletely distended. Again, there is asymmetric bladder wall thickening with a large mass in the bladder. Increased density suggest hemorrhage. Some of this may represent blood clots adjacent to the mass. Note gas in the bladder. There is bladder wall thickening antral laterally towards the right with infiltration in the surrounding fat, likely indicating direct invasion of the surrounding fat. No significant  lymphadenopathy in the pelvis. Appendix is normal. Surgical clips in the pelvis. Vascular calcifications and stent in the left external iliac artery. Femoral femoral bypass graft. Left inguinal hernia containing fat. Old fracture deformity of the right inferior pubic ramus. Compression fractures of L3 and L4, unchanged since previous study. No destructive bone lesions.  IMPRESSION: The most significant finding is a large mass and blood clot in the bladder with asymmetric bladder wall thickening to the right anteriorly. Probable direct invasion in the pelvic fat around the bladder. No significant lymphadenopathy. Multiple incidental findings as discussed.   Electronically Signed   By: Lucienne Capers M.D.   On: 09/24/2014 06:18     EKG Interpretation None     Will obtain CT  MDM   Final diagnoses:  Hematuria  Malignant neoplasm of urinary bladder, unspecified site         Garald Balding, NP 09/24/14 [id number]

## 2014-09-24 NOTE — ED Provider Notes (Signed)
PROGRESS NOTE                                                                                                                 This is a sign-out from NP Delena Bali at shift change: Julian Hale is a 80 y.o. male with known bladder cancer presenting with suprapubic pain and hematuria and uncomfortable urination. H&H is stable.. Plan is to followup noncontrast CT. Please refer to previous note for full HPI, ROS, PMH and PE.   Patient reports difficulty urinating. Bladder scan shows no urine in the bladder. CT abdomen pelvis reveals a large mass and blood clot in the bladder with asymmetric bladder wall thickening. Urology consult from Dr. Wynetta Emery appreciated: He will come to the ED to evaluate the patient and placed a hematuria Foley.  Urology will admit the patient.   Monico Blitz, PA-C 09/24/14 1145

## 2014-09-24 NOTE — ED Notes (Signed)
Dr. Wynetta Emery inserts hematuria foley and irrigates bladder until clots no longer appear (~1 1/2 liters).  He then speaks with pt. And family about need to go to [facility]. Pt. Is in no distress.

## 2014-09-24 NOTE — Consult Note (Signed)
Urology Consult   Physician requesting consult: Dr. Lars Masson, ED  Reason for consult: Hematuria, bladder cancer  History of Present Illness: Julian Hale is a 80 y.o. male with history of muscle invasive bladder cancer diagnosed in 10/2013 by Dr. Karsten Ro. Definitive treatment was recommended, however he refused at that time. He was seen in the ER about 2 weeks ago with hematuria and clots and had his bladder hand irrigated but was not able to tolerate the foley for more than 24 hours and had this removed. Since then, he has had significant hematuria with clot passage and suprapubic pain.   Past Medical History  Diagnosis Date  . ASHD (arteriosclerotic heart disease)   . COPD (chronic obstructive pulmonary disease)   . GERD (gastroesophageal reflux disease)   . BPH (benign prostatic hyperplasia)   . Stroke 01/1999    "mild" (11/16/2012)  . Hypertension   . Hypercholesteremia   . Exertional dyspnea   . S/P angioplasty with stent, to high grade Lt external iliac atery  11/16/12 11/17/2012  . Hematoma of groin, at cath site 11/17/2012  . Acute blood loss anemia, lt groin hematoma 11/17/2012  . CAD (coronary artery disease)   . S/P CABG (coronary artery bypass graft) 2006  . PAD (peripheral artery disease)     Past Surgical History  Procedure Laterality Date  . Cholecystectomy open    . Iliac artery stent  11/16/2012    95% focal stenosis in L external iliac artery, stented w/ a 60mmx3cm Cordis Smart Nitinol self-expanding stent, resulting in reduction of a 95% stenosis to 0% residual.  . Colectomy  ~ 2006    "partial" (11/16/2012)  . Lea doppler  12/17/2012    Normal  . Cardiovascular stress test  10/15/2012    Lexiscan. Basal to Mid Inferolateral bowel artifact. No Lexiscan EKG changes, non-diagnostic for ischemia. No reversible ischemia.  . Transthoracic echocardiogram  04/12/2013    EF 55-60%, LA moderately dilated,   . Femoral-femoral bypass graft    . Eye surgery       bilateral cataracts  . Hernia repair      left  . Transurethral resection of bladder tumor with gyrus (turbt-gyrus) N/A 11/04/2013    Procedure: TRANSURETHRAL RESECTION OF BLADDER TUMOR WITH GYRUS (TURBT-GYRUS);  Surgeon: Claybon Jabs, MD;  Location: [facility];  Service: Urology;  Laterality: N/A;    Medications:  Home meds:    Medication List    ASK your doctor about these medications       finasteride 5 MG tablet  Commonly known as:  PROSCAR  Take 1 tablet (5 mg total) by mouth daily.     HYDROcodone-acetaminophen 5-325 MG per tablet  Commonly known as:  NORCO/VICODIN  Take 1-2 tablets by mouth every 4 (four) hours as needed for moderate pain.     isosorbide mononitrate 30 MG 24 hr tablet  Commonly known as:  IMDUR  Take 1 tablet (30 mg total) by mouth every morning.     metoprolol 50 MG tablet  Commonly known as:  LOPRESSOR  Take 50 mg by mouth 2 (two) times daily.     omeprazole 20 MG capsule  Commonly known as:  PRILOSEC  Take 20 mg by mouth daily.     simvastatin 80 MG tablet  Commonly known as:  ZOCOR  Take 40 mg by mouth daily.     solifenacin 10 MG tablet  Commonly known as:  VESICARE  Take 10 mg by mouth daily.  terazosin 1 MG capsule  Commonly known as:  HYTRIN  Take 1 mg by mouth 2 (two) times daily.        Scheduled Meds: Continuous Infusions: PRN Meds:.    Allergies: No Known Allergies  History reviewed. No pertinent family history.  Social History:  reports that he quit smoking about 30 years ago. His smoking use included Cigarettes. He has a 60 pack-year smoking history. He has never used smokeless tobacco. He reports that he drinks about 3 ounces of alcohol per week. He reports that he does not use illicit drugs.  ROS: A complete review of systems was performed.  All systems are negative except for pertinent findings as noted.  Physical Exam:  Vital signs in last 24 hours: Temp:  [98.3 F (36.8 C)] 98.3 F (36.8 C) (10/04  0314) Pulse Rate:  [60-66] 64 (10/04 0736) Resp:  [15-18] 18 (10/04 0736) BP: (121-150)/(55-81) 121/55 mmHg (10/04 0736) SpO2:  [97 %-100 %] 99 % (10/04 0736) Constitutional:  Alert and oriented, No acute distress Cardiovascular: Regular rate and rhythm, No JVD Respiratory: Normal respiratory effort, Lungs clear bilaterally GI: Abdomen is soft, nontender, nondistended, no abdominal masses Genitourinary: No CVAT. Normal male phallus, testes are descended bilaterally and non-tender and without masses, scrotum is normal in appearance without lesions or masses, perineum is normal on inspection. Lymphatic: No lymphadenopathy Neurologic: Grossly intact, no focal deficits Psychiatric: Normal mood and affect  Laboratory Data:   Recent Labs  09/24/14 0411 09/24/14 0418  WBC 7.8  --   HGB 9.0* 9.2*  HCT 27.6* 27.0*  PLT 195  --      Recent Labs  09/24/14 0418  NA 139  K 3.9  CL 106  GLUCOSE 94  BUN 12  CREATININE 1.10   Renal Function:  Recent Labs  09/24/14 0418  CREATININE 1.10   The CrCl is unknown because both a height and weight (above a minimum accepted value) are required for this calculation.  Radiologic Imaging: Ct Abdomen Pelvis Wo Contrast  09/24/2014   CLINICAL DATA:  Hematuria and pain with urination. Known bladder cancer, not treated.  EXAM: CT ABDOMEN AND PELVIS WITHOUT CONTRAST  TECHNIQUE: Multidetector CT imaging of the abdomen and pelvis was performed following the standard protocol without IV contrast.  COMPARISON:  09/09/2014  FINDINGS: The lung bases are clear.  Moderate-sized esophageal hiatal hernia.  Surgical absence of the gallbladder. Unenhanced appearance of the liver, spleen, pancreas, adrenal glands, inferior vena cava, and retroperitoneal lymph nodes is unremarkable. Calcification of the abdominal aorta without aneurysm. Kidneys demonstrate multiple masses shown to represent cysts on prior studies. No hydronephrosis in either kidney. Stomach, small  bowel, and colon are not abnormally distended. No free air or free fluid in the abdomen.  The bladder is incompletely distended. Again, there is asymmetric bladder wall thickening with a large mass in the bladder. Increased density suggest hemorrhage. Some of this may represent blood clots adjacent to the mass. Note gas in the bladder. There is bladder wall thickening antral laterally towards the right with infiltration in the surrounding fat, likely indicating direct invasion of the surrounding fat. No significant lymphadenopathy in the pelvis. Appendix is normal. Surgical clips in the pelvis. Vascular calcifications and stent in the left external iliac artery. Femoral femoral bypass graft. Left inguinal hernia containing fat. Old fracture deformity of the right inferior pubic ramus. Compression fractures of L3 and L4, unchanged since previous study. No destructive bone lesions.  IMPRESSION: The most significant finding is a  large mass and blood clot in the bladder with asymmetric bladder wall thickening to the right anteriorly. Probable direct invasion in the pelvic fat around the bladder. No significant lymphadenopathy. Multiple incidental findings as discussed.   Electronically Signed   By: Lucienne Capers M.D.   On: 09/24/2014 06:18    I independently reviewed the above imaging studies.  Impression/Recommendation: 9M with pT2 bladder cancer diagnosed in 10/2013 who refused definitive treatment initially. He now appears to have locally advanced disease based on CT scan of abdominal/pelvis. No recent chest imaging. 2L of irrigation was used to hand-irrigate his bladder to remove ~200cc of clot. Urine still blood tinged.   Discussed need for local control of tumor and bleeding for quality of life. Consented for palliative TURBT and clot evac today.   We will then complete staging evaluation with CXR and refer to medical oncology for discussion of chemotherapy options.   Seen and examined with Dr.  Junious Silk who agrees.

## 2014-09-24 NOTE — ED Notes (Signed)
Note:  All belongings are sent with family.  Pt. Remains in no distress--will transport to O.R. Shortly.

## 2014-09-24 NOTE — ED Provider Notes (Signed)
Medical screening examination/treatment/procedure(s) were performed by non-physician practitioner and as supervising physician I was immediately available for consultation/collaboration.   EKG Interpretation None       Saige Canton K Zhane Bluitt-Rasch, MD 09/24/14 2356

## 2014-09-24 NOTE — ED Notes (Addendum)
Pt has hx of bladder cancer. Pt has hematuria x2 days. Pt states pain only occurs when he urinates in which pain is 10/10. Pt told he could have his bladder removed or have radiation pt denied both. Pt has changed his mind and now wants tx now. Pt is alert and oriented.

## 2014-09-24 NOTE — Consult Note (Signed)
Patient was personally seen and examined. Clot retention, T3 bladder cancer. Discussed with Dr. Wynetta Emery, agree with assessment and plan. Discussed with patient, daughter, grandson I discussed with the patient the nature, potential benefits, risks and alternatives to cysto, clot evacuation, TURBT, including side effects of the proposed treatment, the likelihood of the patient achieving the goals of the procedure, and any potential problems that might occur during the procedure or recuperation. Discussed this is a temporary maneuver. Discussed TURBT may not alleviate the frequency and urgency. All questions answered. They elect to proceed. In the long run we discussed continued surveillance, role of TUR alone as above, radiation +\- chemo and cystectomy +\- chemo. They will consider.

## 2014-09-24 NOTE — Anesthesia Postprocedure Evaluation (Signed)
  Anesthesia Post-op Note  Patient: Julian Hale  Procedure(s) Performed: Procedure(s) (LRB): TRANSURETHRAL RESECTION OF BLADDER TUMOR (TURBT) GREATER THAN 5 CM (N/A) CYSTOSCOPY WITH CLOT EVACUATION, RIGHT RETROGRADE, RIGHT URETERAL STENT PLACEMENT (Right)  Patient Location: PACU  Anesthesia Type: General  Level of Consciousness: awake and alert   Airway and Oxygen Therapy: Patient Spontanous Breathing  Post-op Pain: mild  Post-op Assessment: Post-op Vital signs reviewed, Patient's Cardiovascular Status Stable, Respiratory Function Stable, Patent Airway and No signs of Nausea or vomiting  Last Vitals:  Filed Vitals:   09/24/14 0856  BP: 156/89  Pulse: 89  Temp:   Resp: 18    Post-op Vital Signs: stable   Complications: No apparent anesthesia complications

## 2014-09-24 NOTE — Discharge Instructions (Signed)
1. You may see some blood in the urine and may have some burning with urination for 48-72 hours. You also may notice that you have to urinate more frequently or urgently after your procedure which is normal.  2. You should call should you develop an inability urinate, fever > 101, persistent nausea and vomiting that prevents you from eating or drinking to stay hydrated.  3. You have a stent. you will likely urinate more frequently and urgently until the stent is removed and you may experience some discomfort/pain in the lower abdomen and flank especially when urinating. You may take pain medication prescribed to you if needed for pain. You may also intermittently have blood in the urine until the stent is removed. It is essential that you follow up for stent removal as instructed. If the stent is left in place, this could result in permanent damage to and even loss of the kidney, as well as other complications like infections and kidney stones.  4. You were discharged with a catheter. You will be taught how to take care of the catheter by the nursing staff prior to discharge from the hospital.  You may periodically feel a strong urge to void with the catheter in place.  This is a bladder spasm and most often can occur when having a bowel movement or moving around. It is typically self-limited and usually will stop after a few minutes.  You may use some Vaseline or Neosporin around the tip of the catheter to reduce friction at the tip of the penis. You may also see some blood in the urine.  A very small amount of blood can make the urine look quite red.  As long as the catheter is draining well, there usually is not a problem.  However, if the catheter is not draining well and is bloody, you should call the office [phone]) to notify us. 5.

## 2014-09-24 NOTE — Op Note (Addendum)
Preoperative diagnosis:  1. Bladder cancer, hematuria with clot retention  Postoperative diagnosis: 1. Same  Procedure(s): 1. Cystourethroscopy with clot evacuation 2. TURBT >5cm 3. Right retrograde pyelogram with interpretation 4. Right ureteral stent placement  Surgeon: Dr. Festus Aloe  Anesthesia: General  Complications: None  EBL: Minimal  Specimens: Bladder tumor for pathology  Intraoperative findings:  EUA - suprisingly normal. Prostate normal with mild enlargement. i could not readily palpate large bladder tumor. Nothing in the pelvis was fixed.   Cystoscopy - Large necrotic, bleeding tumor involving the entire right posterior and lateral wall, up towards the dome, and extending to the trigone and involving the right ureteral orifice. Resection to non-bleeding tissue was performed with extensive fulguration. Right ureteral orifice was involved in tumor and required resection and stent placement to ensure that it would not become obstructed.   Right retrograde pyelogram - outlinged a single ureter, single collecting system unit showing proximal ureter and collecting system with no filling defect, dilation or stricture.   Indication: Known muscle invasive bladder cancer with hematuria and clot retention. Previously refused repeat TURBT. Has thus far refused definitive treatment. Risks, benefits, and alternatives discussed and patient opted for palliative clot evac and TURBT.  Description of procedure:  The patient, consent and site were verified prior to bringing them to the OR where they were placed supine on the OR table. SCDs were placed and IV antibiotics were initiated. General anesthesia was induced and the patient was placed in the dorsal lithotomy position where they were prepped and draped in the usual sterile fashion. Pre-procedure time out was called and the case was begun.  The 22.5Fr rigid cystoscope with 30 degree lens was inserted into the bladder per  urethra with ample lubrication and normal saline running for irrigation. Complete cystoscopy was performed with the above noted findings.  The resectoscope was used to irrigate clot using the Center syringe. Bipolar cautery with the loop was used to resect the majority of the tumor. The button was used to fulgurate. The right ureteral orifice was involved in tumor and was resected using the cutting current until it was able to be cannulated with a wire. Open ended catheter was used to confirm the position of the wire in the renal pelvis after retrograde injection of contrast. A 6Fr x 26cm ureteral stent was advanced over the wire. It was in good position. The bladder tumor was irrigated out and the tumor bed was dry. A 22Fr 3-way foley with 10cc in the balloon was inserted and CBI was started on a slow rate. The drainage was clear.   The patient was then awoken from general anesthesia having tolerated the procedure well.    Attending attestation: I was present for entire procedure. I performed exam under anesthesia personally. I was scrubbed for the key portions of the procedure.

## 2014-09-24 NOTE — Anesthesia Preprocedure Evaluation (Signed)
Anesthesia Evaluation  Patient identified by MRN, date of birth, ID band Patient awake    Reviewed: Allergy & Precautions, H&P , NPO status , Patient's Chart, lab work & pertinent test results  History of Anesthesia Complications Negative for: history of anesthetic complications  Airway Mallampati: II TM Distance: >3 FB Neck ROM: Full    Dental no notable dental hx. (+) Edentulous Upper, Edentulous Lower   Pulmonary COPDformer smoker,  Denies any recent fever, cold, cough, exacerbation, feels at normal state of health breath sounds clear to auscultation  + decreased breath sounds      Cardiovascular hypertension, Pt. on medications and Pt. on home beta blockers + CAD, + CABG and + Peripheral Vascular Disease Rhythm:Regular Rate:Normal     Neuro/Psych CVA, No Residual Symptoms negative psych ROS   GI/Hepatic Neg liver ROS, GERD-  Medicated and Controlled,  Endo/Other  negative endocrine ROS  Renal/GU negative Renal ROS Bladder dysfunction      Musculoskeletal negative musculoskeletal ROS (+)   Abdominal   Peds negative pediatric ROS (+)  Hematology negative hematology ROS (+)   Anesthesia Other Findings   Reproductive/Obstetrics negative OB ROS                           Anesthesia Physical Anesthesia Plan  ASA: III  Anesthesia Plan: General   Post-op Pain Management:    Induction: Intravenous  Airway Management Planned: LMA  Additional Equipment:   Intra-op Plan:   Post-operative Plan: Extubation in OR  Informed Consent: I have reviewed the patients History and Physical, chart, labs and discussed the procedure including the risks, benefits and alternatives for the proposed anesthesia with the patient or authorized representative who has indicated his/her understanding and acceptance.   Dental advisory given  Plan Discussed with: CRNA  Anesthesia Plan Comments:          Anesthesia Quick Evaluation

## 2014-09-24 NOTE — H&P (Signed)
Urology Consult  Physician requesting consult: Dr. Lars Masson, ED  Reason for consult: Hematuria, bladder cancer  History of Present Illness: Julian Hale is a 80 y.o. male with history of muscle invasive bladder cancer diagnosed in 10/2013 by Dr. Karsten Ro. Definitive treatment was recommended, however he refused at that time. He was seen in the ER about 2 weeks ago with hematuria and clots and had his bladder hand irrigated but was not able to tolerate the foley for more than 24 hours and had this removed. Since then, he has had significant hematuria with clot passage and suprapubic pain.  Past Medical History   Diagnosis  Date   .  ASHD (arteriosclerotic heart disease)    .  COPD (chronic obstructive pulmonary disease)    .  GERD (gastroesophageal reflux disease)    .  BPH (benign prostatic hyperplasia)    .  Stroke  01/1999     "mild" (11/16/2012)   .  Hypertension    .  Hypercholesteremia    .  Exertional dyspnea    .  S/P angioplasty with stent, to high grade Lt external iliac atery 11/16/12  11/17/2012   .  Hematoma of groin, at cath site  11/17/2012   .  Acute blood loss anemia, lt groin hematoma  11/17/2012   .  CAD (coronary artery disease)    .  S/P CABG (coronary artery bypass graft)  2006   .  PAD (peripheral artery disease)     Past Surgical History   Procedure  Laterality  Date   .  Cholecystectomy open     .  Iliac artery stent   11/16/2012     95% focal stenosis in L external iliac artery, stented w/ a 50mmx3cm Cordis Smart Nitinol self-expanding stent, resulting in reduction of a 95% stenosis to 0% residual.   .  Colectomy   ~ 2006     "partial" (11/16/2012)   .  Lea doppler   12/17/2012     Normal   .  Cardiovascular stress test   10/15/2012     Lexiscan. Basal to Mid Inferolateral bowel artifact. No Lexiscan EKG changes, non-diagnostic for ischemia. No reversible ischemia.   .  Transthoracic echocardiogram   04/12/2013     EF 55-60%, LA moderately dilated,    .  Femoral-femoral bypass graft     .  Eye surgery       bilateral cataracts   .  Hernia repair       left   .  Transurethral resection of bladder tumor with gyrus (turbt-gyrus)  N/A  11/04/2013     Procedure: TRANSURETHRAL RESECTION OF BLADDER TUMOR WITH GYRUS (TURBT-GYRUS); Surgeon: Claybon Jabs, MD; Location: [facility]; Service: Urology; Laterality: N/A;    Medications:  Home meds:    Medication List     ASK your doctor about these medications       finasteride 5 MG tablet    Commonly known as: PROSCAR    Take 1 tablet (5 mg total) by mouth daily.    HYDROcodone-acetaminophen 5-325 MG per tablet    Commonly known as: NORCO/VICODIN    Take 1-2 tablets by mouth every 4 (four) hours as needed for moderate pain.    isosorbide mononitrate 30 MG 24 hr tablet    Commonly known as: IMDUR    Take 1 tablet (30 mg total) by mouth every morning.    metoprolol 50 MG tablet    Commonly known as: LOPRESSOR  Take 50 mg by mouth 2 (two) times daily.    omeprazole 20 MG capsule    Commonly known as: PRILOSEC    Take 20 mg by mouth daily.    simvastatin 80 MG tablet    Commonly known as: ZOCOR    Take 40 mg by mouth daily.    solifenacin 10 MG tablet    Commonly known as: VESICARE    Take 10 mg by mouth daily.    terazosin 1 MG capsule    Commonly known as: HYTRIN    Take 1 mg by mouth 2 (two) times daily.      Scheduled Meds:  Continuous Infusions:  PRN Meds:.   Allergies: No Known Allergies  History reviewed. No pertinent family history.  Social History: reports that he quit smoking about 30 years ago. His smoking use included Cigarettes. He has a 60 pack-year smoking history. He has never used smokeless tobacco. He reports that he drinks about 3 ounces of alcohol per week. He reports that he does not use illicit drugs.  ROS:  A complete review of systems was performed. All systems are negative except for pertinent findings as noted.  Physical Exam:  Vital signs in last 24  hours:  Temp: [98.3 F (36.8 C)] 98.3 F (36.8 C) (10/04 0314)  Pulse Rate: [60-66] 64 (10/04 0736)  Resp: [15-18] 18 (10/04 0736)  BP: (121-150)/(55-81) 121/55 mmHg (10/04 0736)  SpO2: [97 %-100 %] 99 % (10/04 0736)  Constitutional: Alert and oriented, No acute distress  Cardiovascular: Regular rate and rhythm, No JVD  Respiratory: Normal respiratory effort, Lungs clear bilaterally  GI: Abdomen is soft, nontender, nondistended, no abdominal masses  Genitourinary: No CVAT. Normal male phallus, testes are descended bilaterally and non-tender and without masses, scrotum is normal in appearance without lesions or masses, perineum is normal on inspection.  Lymphatic: No lymphadenopathy  Neurologic: Grossly intact, no focal deficits  Psychiatric: Normal mood and affect  Laboratory Data:   Recent Labs   09/24/14 0411  09/24/14 0418   WBC  7.8  --   HGB  9.0*  9.2*   HCT  27.6*  27.0*   PLT  195  --     Recent Labs   09/24/14 0418   NA  139   K  3.9   CL  106   GLUCOSE  94   BUN  12   CREATININE  1.10    Renal Function:   Recent Labs   09/24/14 0418   CREATININE  1.10    The CrCl is unknown because both a height and weight (above a minimum accepted value) are required for this calculation.  Radiologic Imaging:  Ct Abdomen Pelvis Wo Contrast  09/24/2014 CLINICAL DATA: Hematuria and pain with urination. Known bladder cancer, not treated. EXAM: CT ABDOMEN AND PELVIS WITHOUT CONTRAST TECHNIQUE: Multidetector CT imaging of the abdomen and pelvis was performed following the standard protocol without IV contrast. COMPARISON: 09/09/2014 FINDINGS: The lung bases are clear. Moderate-sized esophageal hiatal hernia. Surgical absence of the gallbladder. Unenhanced appearance of the liver, spleen, pancreas, adrenal glands, inferior vena cava, and retroperitoneal lymph nodes is unremarkable. Calcification of the abdominal aorta without aneurysm. Kidneys demonstrate multiple masses shown to  represent cysts on prior studies. No hydronephrosis in either kidney. Stomach, small bowel, and colon are not abnormally distended. No free air or free fluid in the abdomen. The bladder is incompletely distended. Again, there is asymmetric bladder wall thickening with a large mass in the  bladder. Increased density suggest hemorrhage. Some of this may represent blood clots adjacent to the mass. Note gas in the bladder. There is bladder wall thickening antral laterally towards the right with infiltration in the surrounding fat, likely indicating direct invasion of the surrounding fat. No significant lymphadenopathy in the pelvis. Appendix is normal. Surgical clips in the pelvis. Vascular calcifications and stent in the left external iliac artery. Femoral femoral bypass graft. Left inguinal hernia containing fat. Old fracture deformity of the right inferior pubic ramus. Compression fractures of L3 and L4, unchanged since previous study. No destructive bone lesions. IMPRESSION: The most significant finding is a large mass and blood clot in the bladder with asymmetric bladder wall thickening to the right anteriorly. Probable direct invasion in the pelvic fat around the bladder. No significant lymphadenopathy. Multiple incidental findings as discussed. Electronically Signed By: Lucienne Capers M.D. On: 09/24/2014 06:18   I independently reviewed the above imaging studies.  Impression/Recommendation: 19M with pT2 bladder cancer diagnosed in 10/2013 who refused definitive treatment initially. He now appears to have locally advanced disease based on CT scan of abdominal/pelvis. No recent chest imaging. 2L of irrigation was used to hand-irrigate his bladder to remove ~200cc of clot. Urine still blood tinged.  Discussed need for local control of tumor and bleeding for quality of life. Consented for palliative TURBT and clot evac today.  We will then complete staging evaluation with CXR and refer to medical oncology for  discussion of chemotherapy options.  Seen and examined with Dr. Junious Silk who agrees.  Cosigned by: Festus Aloe, MD [09/24/2014 9:53 AM]   Patient was personally seen and examined. Clot retention, T3 bladder cancer. Discussed with Dr. Wynetta Emery, agree with assessment and plan. Discussed with patient, daughter, grandson I discussed with the patient the nature, potential benefits, risks and alternatives to cysto, clot evacuation, TURBT, including side effects of the proposed treatment, the likelihood of the patient achieving the goals of the procedure, and any potential problems that might occur during the procedure or recuperation. Discussed this is a temporary maneuver. Discussed TURBT may not alleviate the frequency and urgency. All questions answered. They elect to proceed. In the long run we discussed continued surveillance, role of TUR alone as above, radiation +\- chemo and cystectomy +\- chemo. They will consider.  Original Note: Julian Score, MD [09/24/2014 7:39 AM]

## 2014-09-24 NOTE — ED Provider Notes (Signed)
Medical screening examination/treatment/procedure(s) were performed by non-physician practitioner and as supervising physician I was immediately available for consultation/collaboration.   EKG Interpretation None       Vivyan Biggers K Erla Bacchi-Rasch, MD 09/24/14 2329

## 2014-09-24 NOTE — Transfer of Care (Signed)
Immediate Anesthesia Transfer of Care Note  Patient: Roma Schanz  Procedure(s) Performed: Procedure(s): TRANSURETHRAL RESECTION OF BLADDER TUMOR (TURBT) GREATER THAN 5 CM (N/A) CYSTOSCOPY WITH CLOT EVACUATION, RIGHT RETROGRADE, RIGHT URETERAL STENT PLACEMENT (Right)  Patient Location: PACU  Anesthesia Type:General  Level of Consciousness: awake and alert   Airway & Oxygen Therapy: Patient Spontanous Breathing and Patient connected to face mask oxygen  Post-op Assessment: Report given to PACU RN and Post -op Vital signs reviewed and stable  Post vital signs: Reviewed and stable  Complications: No apparent anesthesia complications

## 2014-09-25 ENCOUNTER — Encounter (HOSPITAL_COMMUNITY): Payer: Self-pay | Admitting: Urology

## 2014-09-25 DIAGNOSIS — Z87891 Personal history of nicotine dependence: Secondary | ICD-10-CM | POA: Diagnosis not present

## 2014-09-25 DIAGNOSIS — N3289 Other specified disorders of bladder: Secondary | ICD-10-CM | POA: Diagnosis present

## 2014-09-25 DIAGNOSIS — Z9049 Acquired absence of other specified parts of digestive tract: Secondary | ICD-10-CM | POA: Diagnosis present

## 2014-09-25 DIAGNOSIS — Z951 Presence of aortocoronary bypass graft: Secondary | ICD-10-CM | POA: Diagnosis not present

## 2014-09-25 DIAGNOSIS — D638 Anemia in other chronic diseases classified elsewhere: Secondary | ICD-10-CM | POA: Diagnosis present

## 2014-09-25 DIAGNOSIS — R319 Hematuria, unspecified: Secondary | ICD-10-CM

## 2014-09-25 DIAGNOSIS — I2510 Atherosclerotic heart disease of native coronary artery without angina pectoris: Secondary | ICD-10-CM | POA: Diagnosis present

## 2014-09-25 DIAGNOSIS — E46 Unspecified protein-calorie malnutrition: Secondary | ICD-10-CM | POA: Diagnosis present

## 2014-09-25 DIAGNOSIS — J449 Chronic obstructive pulmonary disease, unspecified: Secondary | ICD-10-CM | POA: Diagnosis present

## 2014-09-25 DIAGNOSIS — I10 Essential (primary) hypertension: Secondary | ICD-10-CM | POA: Diagnosis present

## 2014-09-25 DIAGNOSIS — R310 Gross hematuria: Secondary | ICD-10-CM | POA: Diagnosis present

## 2014-09-25 DIAGNOSIS — C679 Malignant neoplasm of bladder, unspecified: Secondary | ICD-10-CM

## 2014-09-25 DIAGNOSIS — D62 Acute posthemorrhagic anemia: Secondary | ICD-10-CM | POA: Diagnosis present

## 2014-09-25 DIAGNOSIS — Z8673 Personal history of transient ischemic attack (TIA), and cerebral infarction without residual deficits: Secondary | ICD-10-CM | POA: Diagnosis not present

## 2014-09-25 DIAGNOSIS — C678 Malignant neoplasm of overlapping sites of bladder: Secondary | ICD-10-CM | POA: Diagnosis present

## 2014-09-25 DIAGNOSIS — K567 Ileus, unspecified: Secondary | ICD-10-CM | POA: Diagnosis not present

## 2014-09-25 DIAGNOSIS — E785 Hyperlipidemia, unspecified: Secondary | ICD-10-CM | POA: Diagnosis present

## 2014-09-25 DIAGNOSIS — D649 Anemia, unspecified: Secondary | ICD-10-CM

## 2014-09-25 DIAGNOSIS — K219 Gastro-esophageal reflux disease without esophagitis: Secondary | ICD-10-CM | POA: Diagnosis present

## 2014-09-25 DIAGNOSIS — R531 Weakness: Secondary | ICD-10-CM

## 2014-09-25 DIAGNOSIS — Z6822 Body mass index (BMI) 22.0-22.9, adult: Secondary | ICD-10-CM | POA: Diagnosis not present

## 2014-09-25 LAB — BASIC METABOLIC PANEL
Anion gap: 12 (ref 5–15)
BUN: 12 mg/dL (ref 6–23)
CHLORIDE: 107 meq/L (ref 96–112)
CO2: 20 meq/L (ref 19–32)
Calcium: 8.2 mg/dL — ABNORMAL LOW (ref 8.4–10.5)
Creatinine, Ser: 1.11 mg/dL (ref 0.50–1.35)
GFR calc non Af Amer: 58 mL/min — ABNORMAL LOW (ref >90)
GFR, EST AFRICAN AMERICAN: 67 mL/min — AB (ref >90)
Glucose, Bld: 130 mg/dL — ABNORMAL HIGH (ref 70–99)
Potassium: 3.8 mEq/L (ref 3.7–5.3)
Sodium: 139 mEq/L (ref 137–147)

## 2014-09-25 LAB — HEMOGLOBIN AND HEMATOCRIT, BLOOD
HCT: 26.4 % — ABNORMAL LOW (ref 39.0–52.0)
Hemoglobin: 8.4 g/dL — ABNORMAL LOW (ref 13.0–17.0)

## 2014-09-25 MED ORDER — SENNOSIDES-DOCUSATE SODIUM 8.6-50 MG PO TABS
1.0000 | ORAL_TABLET | Freq: Two times a day (BID) | ORAL | Status: DC
  Administered 2014-09-25 – 2014-09-27 (×4): 1 via ORAL
  Filled 2014-09-25 (×7): qty 1

## 2014-09-25 MED ORDER — FERROUS SULFATE 300 (60 FE) MG/5ML PO SYRP
300.0000 mg | ORAL_SOLUTION | Freq: Two times a day (BID) | ORAL | Status: DC
  Administered 2014-09-25 – 2014-09-27 (×2): 300 mg via ORAL
  Filled 2014-09-25 (×6): qty 5

## 2014-09-25 MED ORDER — POLYETHYLENE GLYCOL 3350 17 G PO PACK
17.0000 g | PACK | Freq: Every day | ORAL | Status: DC
  Administered 2014-09-25 – 2014-09-26 (×2): 17 g via ORAL
  Filled 2014-09-25 (×3): qty 1

## 2014-09-25 MED ORDER — FERROUS SULFATE 220 (44 FE) MG/5ML PO ELIX
220.0000 mg | ORAL_SOLUTION | Freq: Two times a day (BID) | ORAL | Status: DC
  Filled 2014-09-25 (×2): qty 5

## 2014-09-25 MED ORDER — ENSURE COMPLETE PO LIQD
237.0000 mL | Freq: Two times a day (BID) | ORAL | Status: DC
  Administered 2014-09-25 – 2014-09-27 (×4): 237 mL via ORAL

## 2014-09-25 MED ORDER — BELLADONNA ALKALOIDS-OPIUM 16.2-60 MG RE SUPP
1.0000 | Freq: Four times a day (QID) | RECTAL | Status: DC | PRN
  Administered 2014-09-25: 1 via RECTAL
  Filled 2014-09-25: qty 1

## 2014-09-25 MED ORDER — METOPROLOL TARTRATE 25 MG PO TABS
25.0000 mg | ORAL_TABLET | Freq: Two times a day (BID) | ORAL | Status: DC
  Administered 2014-09-25 – 2014-09-27 (×4): 25 mg via ORAL
  Filled 2014-09-25 (×5): qty 1

## 2014-09-25 MED ORDER — ALBUTEROL SULFATE (2.5 MG/3ML) 0.083% IN NEBU: 2.5000 mg | INHALATION_SOLUTION | RESPIRATORY_TRACT | Status: DC | PRN

## 2014-09-25 NOTE — Progress Notes (Signed)
1 Day Post-Op Subjective: Slept well from 8pm - 1am. Woke up feeling nauseated and had small volume emesis. Received zofran x 2. Not tolerating clears. Complaint of significant suprapubic pain. Foley draining clear without clots or debris.   Objective: Vital signs in last 24 hours: Temp:  [97.8 F (36.6 Hale)-98.2 F (36.8 Hale)] 98.2 F (36.8 Hale) (10/05 0551) Pulse Rate:  [64-99] 70 (10/05 0551) Resp:  [16-27] 20 (10/05 0149) BP: (114-156)/(43-89) 114/43 mmHg (10/05 0551) SpO2:  [94 %-100 %] 94 % (10/05 0551) Weight:  [70.76 kg (156 lb)] 70.76 kg (156 lb) (10/04 1341)  Intake/Output from previous day: 10/04 0701 - 10/05 0700 In: 4825 [I.V.:2125] Out: 2750 [Urine:2750] Intake/Output this shift: Total I/O In: 1425 [I.V.:825; Other:600] Out: 1350 [Urine:1350]  Physical Exam:  General: Alert and oriented CV: RRR Lungs: Clear Abdomen: Soft, ND, tender to palpation in suprapubic area diffusely Foley: Clear, blue tinged. No clots or debris. Ext: NT, No erythema  Lab Results:  CBC Latest Ref Rng 09/25/2014 09/24/2014 09/24/2014  WBC 4.0 - 10.5 K/uL - - 7.8  Hemoglobin 13.0 - 17.0 g/dL 8.4(L) 9.2(L) 9.0(L)  Hematocrit 39.0 - 52.0 % 26.4(L) 27.0(L) 27.6(L)  Platelets 150 - 400 K/uL - - 195      BMET  Recent Labs  09/24/14 0418 09/25/14 0435  NA 139 139  K 3.9 3.8  CL 106 107  CO2  --  20  GLUCOSE 94 130*  BUN 12 12  CREATININE 1.10 1.11  CALCIUM  --  8.2*     Studies/Results: Ct Abdomen Pelvis Wo Contrast  09/24/2014   CLINICAL DATA:  Hematuria and pain with urination. Known bladder cancer, not treated.  EXAM: CT ABDOMEN AND PELVIS WITHOUT CONTRAST  TECHNIQUE: Multidetector CT imaging of the abdomen and pelvis was performed following the standard protocol without IV contrast.  COMPARISON:  09/09/2014  FINDINGS: The lung bases are clear.  Moderate-sized esophageal hiatal hernia.  Surgical absence of the gallbladder. Unenhanced appearance of the liver, spleen, pancreas,  adrenal glands, inferior vena cava, and retroperitoneal lymph nodes is unremarkable. Calcification of the abdominal aorta without aneurysm. Kidneys demonstrate multiple masses shown to represent cysts on prior studies. No hydronephrosis in either kidney. Stomach, small bowel, and colon are not abnormally distended. No free air or free fluid in the abdomen.  The bladder is incompletely distended. Again, there is asymmetric bladder wall thickening with a large mass in the bladder. Increased density suggest hemorrhage. Some of this may represent blood clots adjacent to the mass. Note gas in the bladder. There is bladder wall thickening antral laterally towards the right with infiltration in the surrounding fat, likely indicating direct invasion of the surrounding fat. No significant lymphadenopathy in the pelvis. Appendix is normal. Surgical clips in the pelvis. Vascular calcifications and stent in the left external iliac artery. Femoral femoral bypass graft. Left inguinal hernia containing fat. Old fracture deformity of the right inferior pubic ramus. Compression fractures of L3 and L4, unchanged since previous study. No destructive bone lesions.  IMPRESSION: The most significant finding is a large mass and blood clot in the bladder with asymmetric bladder wall thickening to the right anteriorly. Probable direct invasion in the pelvic fat around the bladder. No significant lymphadenopathy. Multiple incidental findings as discussed.   Electronically Signed   By: Lucienne Capers M.D.   On: 09/24/2014 06:18    Assessment/Plan: 71M with pT2 bladder cancer diagnosed in 10/2013 who refused definitive treatment initially. He now appears to have locally advanced  disease based on CT scan of abdominal/pelvis. No recent chest imaging. Required palliative TURBT for ongoing bleeding and clot retention, as well as right ureteral stent placement as tumor involved right ureteral orifice on 09/24/2014. POD#1 today.  -- Consider  D/Hale foley today if pain continues to be difficult to control -- B&O suppositories q6 hrs -- Continue zofran PRN -- Advance diet as tolerated when nausea improves -- Dispo: floor until symptoms improve and we determine status of foley. Plan was initially to d/Hale home with foley, but considering significant discomfort/bladder spasms, may remove prior to discharge.     LOS: 1 day   Julian Hale 09/25/2014, 6:34 AM

## 2014-09-25 NOTE — Progress Notes (Addendum)
Patient ID: Julian Hale, male   DOB: 1926/02/05, 80 y.o.   MRN: [id number]   Pt having bladder spasms. Urine clear. He is sleeping. Nausea improved.   PE: Resting Abd - soft, NT, ND GU - urine clear   A/p - T3 bladder cancer s/p TURBT - -PT consult  -will d/c foley for void trial -blood pressure a little low will have hospitalist look over   I discussed patient with Dr. Wynetta Emery. I agree with his assessment and plan.

## 2014-09-25 NOTE — Progress Notes (Signed)
UR completed 

## 2014-09-25 NOTE — Consult Note (Signed)
[facility]   Patient Demographics  Julian Hale, is a 80 y.o. male  CSN: [id number]  MRN: [id number]  DOB - Feb 01, 1926  Admit date - 09/24/2014  Admitting Physician Festus Aloe, MD  Outpatient Primary MD for the patient is Wyatt Haste, MD  LOS - 1   Consult requested in the Hospital by Claybon Jabs, MD, On 09/25/2014    Reason for consult : Medical management    Assessment & Plan  1. Active Problems:   GERD (gastroesophageal reflux disease)   CAD, CABG in [place] 2006. OP Myoview low risk 10/15/12   Dyslipidemia   COPD on exam (remote smoker)   Bladder cancer   Anemia   Weakness  Bladder cancer with hematuria: -Appears to be having locally advanced cancer, status post palliative TURBT for ongoing bleeding and clot retention, as well as right ureteral stent placement as tumor involved right ureteral orifice on 09/24/2014 -Management as per primary neurology team.  History of carotid artery disease, peripheral vascular disease -Patient denies any chest pain, shortness of breath, claudication, no anticoagulation at this point given his hematuria. -Will decrease his metoprolol given his soft blood pressure, will DC Imdur.  Anemia -This is most likely in the setting of anemia of chronic disease, worsened by blood loss secondary to hematuria. -At this point there is no indication for transfusion, start on iron supplement.  History of COPD -Patient has no active wheezing, keep on when necessary albuterol. -We'll try to wean off oxygen  Weakness/debilitation/malnutrition -PT consult, to go to chair, nutrition supplement, nutrition consult  Dyslipidemia -Continue statin.  DVT Prophylaxis CDs  Please review my Orders  Family Communication - Plan of care including tests being ordered have been discussed with the patient who indicate understanding and agree with the plan  Thank you for the consult, we will  follow the patient with you in the Hospital.   With History of -  Active Problems:   GERD (gastroesophageal reflux disease)   CAD, CABG in [place]. OP Myoview low risk 10/15/12   Dyslipidemia   COPD on exam (remote smoker)   Bladder cancer   Anemia   Weakness   Past Medical History  Diagnosis Date  . ASHD (arteriosclerotic heart disease)   . COPD (chronic obstructive pulmonary disease)   . GERD (gastroesophageal reflux disease)   . BPH (benign prostatic hyperplasia)   . Stroke 01/1999    "mild" (11/16/2012)  . Hypertension   . Hypercholesteremia   . Exertional dyspnea   . S/P angioplasty with stent, to high grade Lt external iliac atery  11/16/12 11/17/2012  . Hematoma of groin, at cath site 11/17/2012  . Acute blood loss anemia, lt groin hematoma 11/17/2012  . CAD (coronary artery disease)   . S/P CABG (coronary artery bypass graft) 2006  . PAD (peripheral artery disease)      Past Surgical History  Procedure Laterality Date  . Cholecystectomy open    . Iliac artery stent  11/16/2012    95% focal stenosis in L external iliac artery, stented w/ a 54mmx3cm Cordis Smart Nitinol self-expanding stent, resulting in reduction of a 95% stenosis to 0% residual.  . Colectomy  ~ 2006    "partial" (11/16/2012)  . Lea doppler  12/17/2012    Normal  . Cardiovascular stress test  10/15/2012    Lexiscan. Basal to Mid Inferolateral bowel artifact. No Lexiscan EKG changes, non-diagnostic for ischemia. No reversible ischemia.  . Transthoracic echocardiogram  04/12/2013    EF 55-60%,  LA moderately dilated,   . Femoral-femoral bypass graft    . Eye surgery      bilateral cataracts  . Hernia repair      left  . Transurethral resection of bladder tumor with gyrus (turbt-gyrus) N/A 11/04/2013    Procedure: TRANSURETHRAL RESECTION OF BLADDER TUMOR WITH GYRUS (TURBT-GYRUS);  Surgeon: Claybon Jabs, MD;  Location: [facility];  Service: Urology;  Laterality: N/A;  . Transurethral resection of  bladder tumor N/A 09/24/2014    Procedure: TRANSURETHRAL RESECTION OF BLADDER TUMOR (TURBT) GREATER THAN 5 CM;  Surgeon: Festus Aloe, MD;  Location: [facility];  Service: Urology;  Laterality: N/A;  . Cystoscopy Right 09/24/2014    Procedure: CYSTOSCOPY WITH CLOT EVACUATION, RIGHT RETROGRADE, RIGHT URETERAL STENT PLACEMENT;  Surgeon: Festus Aloe, MD;  Location: [facility];  Service: Urology;  Laterality: Right;    Past Surgical History  Procedure Laterality Date  . Cholecystectomy open    . Iliac artery stent  11/16/2012    95% focal stenosis in L external iliac artery, stented w/ a 30mmx3cm Cordis Smart Nitinol self-expanding stent, resulting in reduction of a 95% stenosis to 0% residual.  . Colectomy  ~ 2006    "partial" (11/16/2012)  . Lea doppler  12/17/2012    Normal  . Cardiovascular stress test  10/15/2012    Lexiscan. Basal to Mid Inferolateral bowel artifact. No Lexiscan EKG changes, non-diagnostic for ischemia. No reversible ischemia.  . Transthoracic echocardiogram  04/12/2013    EF 55-60%, LA moderately dilated,   . Femoral-femoral bypass graft    . Eye surgery      bilateral cataracts  . Hernia repair      left  . Transurethral resection of bladder tumor with gyrus (turbt-gyrus) N/A 11/04/2013    Procedure: TRANSURETHRAL RESECTION OF BLADDER TUMOR WITH GYRUS (TURBT-GYRUS);  Surgeon: Claybon Jabs, MD;  Location: [facility];  Service: Urology;  Laterality: N/A;  . Transurethral resection of bladder tumor N/A 09/24/2014    Procedure: TRANSURETHRAL RESECTION OF BLADDER TUMOR (TURBT) GREATER THAN 5 CM;  Surgeon: Festus Aloe, MD;  Location: [facility];  Service: Urology;  Laterality: N/A;  . Cystoscopy Right 09/24/2014    Procedure: CYSTOSCOPY WITH CLOT EVACUATION, RIGHT RETROGRADE, RIGHT URETERAL STENT PLACEMENT;  Surgeon: Festus Aloe, MD;  Location: [facility];  Service: Urology;  Laterality: Right;    HPI:-  Julian Hale [id number] is a 80 y.o. male, with  known history of bladder cancer, initially refused definitive treatment, at this point appear to be having advanced disease on CT abdomen and pelvis, patient had palliative TURBT for ongoing bleeding and clot retention, as well as right ureteral stent placement as tumor involved right ureteral orifice on 09/24/2014, patient has been generally weak, frail, complains of nausea and vomiting yesterday, as well as anemia, with a known history of multiple comorbidities including coronary artery disease, peripheral vascular disease, hyperlipidemia, and blood pressure was on the low side, so medicine were consulted.       Review of Systems    In addition to the HPI above,  No Fever-chills, No Headache, No changes with Vision or hearing, Reports poor appetite No Chest pain, Cough or Shortness of Breath, No Abdominal pain, No Nausea or Vommitting, Bowel movements are regular, Had complaints of hematuria and dysuria No new skin rashes or bruises, No new joints pains-aches,  No new weakness, tingling, numbness in any extremity, No recent weight gain or loss, No polyuria, polydypsia or polyphagia, No significant Mental Stressors.  A  full 10 point review of systems was obtained except as dictated above all other review of systems are negative.     Social History History  Substance Use Topics  . Smoking status: Former Smoker -- 2.00 packs/day for 30 years    Types: Cigarettes    Quit date: 07/31/1984  . Smokeless tobacco: Never Used     Comment: 11/16/2012 "quit smoking cigarettes in the 1970's"  . Alcohol Use: 3.0 oz/week    5 Glasses of wine per week     Comment: 11/16/2012 "wine cooler 4-5 nights/wk"     Family History History reviewed. No pertinent family history.   Prior to Admission medications   Medication Sig Start Date End Date Taking? Authorizing Provider  finasteride (PROSCAR) 5 MG tablet Take 1 tablet (5 mg total) by mouth daily. 01/31/14  Yes Denita Lung, MD   HYDROcodone-acetaminophen (NORCO/VICODIN) 5-325 MG per tablet Take 1-2 tablets by mouth every 4 (four) hours as needed for moderate pain.   Yes Historical Provider, MD  isosorbide mononitrate (IMDUR) 30 MG 24 hr tablet Take 1 tablet (30 mg total) by mouth every morning. 02/06/14  Yes Denita Lung, MD  metoprolol (LOPRESSOR) 50 MG tablet Take 50 mg by mouth 2 (two) times daily.   Yes Historical Provider, MD  omeprazole (PRILOSEC) 20 MG capsule Take 20 mg by mouth daily.   Yes Historical Provider, MD  simvastatin (ZOCOR) 80 MG tablet Take 40 mg by mouth daily.   Yes Historical Provider, MD  solifenacin (VESICARE) 10 MG tablet Take 10 mg by mouth daily.   Yes Historical Provider, MD  terazosin (HYTRIN) 1 MG capsule Take 1 mg by mouth 2 (two) times daily.   Yes Historical Provider, MD  cephALEXin (KEFLEX) 500 MG capsule Take 1 capsule (500 mg total) by mouth 4 (four) times daily. 09/24/14   Milon Score, MD  HYDROcodone-acetaminophen (NORCO) 5-325 MG per tablet Take 1 tablet by mouth every 6 (six) hours as needed for moderate pain. 09/24/14   Milon Score, MD    No Known Allergies   Physical Exam   Vitals  Blood pressure 99/42, pulse 67, temperature 97.9 F (36.6 C), temperature source Oral, resp. rate 18, height 5\' 10"  (1.778 m), weight 70.76 kg (156 lb), SpO2 90.00%.   1. General frail elderly male lying in bed in NAD,    2. Normal affect and insight, Not Suicidal or Homicidal, Awake Alert, Oriented X 2-3.  3. No F.N deficits, ALL C.Nerves Intact, Strength 5/5 all 4 extremities, Sensation intact all   4 extremities, Plantars down going.  4. Ears and Eyes appear Normal, Conjunctivae clear, PERRLA. Moist Oral Mucosa.  5. Supple Neck, No JVD, No cervical lymphadenopathy appriciated, No Carotid Bruits.  6. Symmetrical Chest wall movement, Good air movement bilaterally, CTAB.  7. RRR, No Gallops, Rubs or Murmurs, No Parasternal Heave.  8. Positive Bowel Sounds, Abdomen Soft, Non  tender, No organomegaly appriciated, No rebound -guarding or rigidity.  9.  No Cyanosis, Normal Skin Turgor, No Skin Rash or Bruise.  10. Good muscle tone,  joints appear normal , no effusions, Normal ROM.  11. No Palpable Lymph Nodes in Neck or Axillae   Data Review  CBC  Recent Labs Lab 09/24/14 0411 09/24/14 0418 09/25/14 0435  WBC 7.8  --   --   HGB 9.0* 9.2* 8.4*  HCT 27.6* 27.0* 26.4*  PLT 195  --   --   MCV 88.2  --   --  MCH 28.8  --   --   MCHC 32.6  --   --   RDW 13.9  --   --   LYMPHSABS 1.4  --   --   MONOABS 0.8  --   --   EOSABS 0.3  --   --   BASOSABS 0.1  --   --     Chemistries   Recent Labs Lab 09/24/14 0418 09/25/14 0435  NA 139 139  K 3.9 3.8  CL 106 107  CO2  --  20  GLUCOSE 94 130*  BUN 12 12  CREATININE 1.10 1.11  CALCIUM  --  8.2*   ------------------------------------------------------------------------------------------------------------------ estimated creatinine clearance is 47.8 ml/min (by C-G formula based on Cr of 1.11). ------------------------------------------------------------------------------------------------------------------ No results found for this basename: HGBA1C,  in the last 72 hours ------------------------------------------------------------------------------------------------------------------ No results found for this basename: CHOL, HDL, LDLCALC, TRIG, CHOLHDL, LDLDIRECT,  in the last 72 hours ------------------------------------------------------------------------------------------------------------------ No results found for this basename: TSH, T4TOTAL, FREET3, T3FREE, THYROIDAB,  in the last 72 hours ------------------------------------------------------------------------------------------------------------------ No results found for this basename: VITAMINB12, FOLATE, FERRITIN, TIBC, IRON, RETICCTPCT,  in the last 72 hours  Coagulation profile No results found for this basename: INR, PROTIME,  in the last  168 hours  No results found for this basename: DDIMER,  in the last 72 hours  Cardiac Enzymes No results found for this basename: CK, CKMB, TROPONINI, MYOGLOBIN,  in the last 168 hours ------------------------------------------------------------------------------------------------------------------ No components found with this basename: POCBNP,   ------------------------------------------------------------------------------------------------------------------- Micro Results No results found for this or any previous visit (from the past 240 hour(s)). ------------------------------------------------------------------------------------------------------------------ Radiology Reports Ct Abdomen Pelvis Wo Contrast  09/24/2014   CLINICAL DATA:  Hematuria and pain with urination. Known bladder cancer, not treated.  EXAM: CT ABDOMEN AND PELVIS WITHOUT CONTRAST  TECHNIQUE: Multidetector CT imaging of the abdomen and pelvis was performed following the standard protocol without IV contrast.  COMPARISON:  09/09/2014  FINDINGS: The lung bases are clear.  Moderate-sized esophageal hiatal hernia.  Surgical absence of the gallbladder. Unenhanced appearance of the liver, spleen, pancreas, adrenal glands, inferior vena cava, and retroperitoneal lymph nodes is unremarkable. Calcification of the abdominal aorta without aneurysm. Kidneys demonstrate multiple masses shown to represent cysts on prior studies. No hydronephrosis in either kidney. Stomach, small bowel, and colon are not abnormally distended. No free air or free fluid in the abdomen.  The bladder is incompletely distended. Again, there is asymmetric bladder wall thickening with a large mass in the bladder. Increased density suggest hemorrhage. Some of this may represent blood clots adjacent to the mass. Note gas in the bladder. There is bladder wall thickening antral laterally towards the right with infiltration in the surrounding fat, likely indicating direct  invasion of the surrounding fat. No significant lymphadenopathy in the pelvis. Appendix is normal. Surgical clips in the pelvis. Vascular calcifications and stent in the left external iliac artery. Femoral femoral bypass graft. Left inguinal hernia containing fat. Old fracture deformity of the right inferior pubic ramus. Compression fractures of L3 and L4, unchanged since previous study. No destructive bone lesions.  IMPRESSION: The most significant finding is a large mass and blood clot in the bladder with asymmetric bladder wall thickening to the right anteriorly. Probable direct invasion in the pelvic fat around the bladder. No significant lymphadenopathy. Multiple incidental findings as discussed.   Electronically Signed   By: Lucienne Capers M.D.   On: 09/24/2014 06:18   Ct Abdomen Pelvis W Contrast  09/09/2014  CLINICAL DATA:  Hematuria.  Dysuria.  History of bladder cancer.  EXAM: CT ABDOMEN AND PELVIS WITH CONTRAST  TECHNIQUE: Multidetector CT imaging of the abdomen and pelvis was performed using the standard protocol following bolus administration of intravenous contrast.  CONTRAST:  43mL OMNIPAQUE IOHEXOL 300 MG/ML SOLN, 168mL OMNIPAQUE IOHEXOL 300 MG/ML SOLN  COMPARISON:  10/21/2013  FINDINGS: Lower chest: Moderate-sized hiatal hernia with 1.0 cm in short axis lymph node or small fluid collection adjacent to the hiatal hernia on the right side.  Hepatobiliary: Prior cholecystectomy.  Otherwise negative.  Spleen: Intact  Pancreas: Intact  Stomach/Bowel: Scattered colonic diverticula. No dilated bowel. Terminal ileum unremarkable. Appendix absent.  Adrenals/urinary tract: There is an 8.2 x 4.1 cm high density filling defect to the right side of the urinary bladder compatible with tumor or blood clot. This has heterogeneous internal density. Tumor is favored. The hematuria protocol was not utilized. No compelling evidence of other enhancement along the urothelium. Bilateral renal cysts are present; a  variety of the tiny renal hypodense lesions are technically too small to characterize.  Vascular/Lymphatic: Aortoiliac atherosclerotic vascular disease. 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 A fem-fem bypass is patent. I do not see definite pathologic pelvic adenopathy.  Reproductive: Prostate gland and seminal vesicles not well seen.  Musculoskeletal: Old right pubic ramus fractures, healed. I do not observe definite osseous metastatic disease. Bony demineralization. Superior endplate compression fractures at L3 and L4 are similar to prior. Left inguinal hernia contain small bowel and adipose tissue, without findings of strangulation or obstruction.  Other: None  IMPRESSION: 1. Enlargement of the right bladder tumor, currently 8.2 x 4.1 cm. Some of this density could represent blood clot adjacent to the known tumor. 2. Moderate-sized hiatal hernia. 3. Remote superior endplate compression fractures at L3 and L4. 4. Left inguinal hernia contains adipose tissue and nondilated/non-strangulated small bowel.   Electronically Signed   By: Sherryl Barters M.D.   On: 09/09/2014 12:27   Dg Esophagus  09/06/2014   CLINICAL DATA:  Dysphagia  EXAM: ESOPHOGRAM/BARIUM SWALLOW  TECHNIQUE: Single contrast examination was performed using  thin barium.  FLUOROSCOPY TIME:  0 min 54 seconds  COMPARISON:  None.  FINDINGS: With the patient's history of coughing after swallowing, the study was begun on the lateral projection. There is some penetration of barium, but no aspiration is seen. There is a prominent cricopharyngeus muscle noted with a is small Zenker's diverticulum present. A small Zenker's diverticulum was described on the dictated report from 07/04/2003. Mild tertiary contractions are noted in the mid and distal esophagus. A small to moderate size hiatal hernia is present. Moderate gastroesophageal reflux is demonstrated. At the referring physician's request, no  barium pill was given.  IMPRESSION: 1. Small to moderate size hiatal hernia with moderate gastroesophageal reflux. 2. Prominent cricopharyngeus muscle with small Zenker's diverticulum, which was described on the prior dictated report from barium swallow of 2004.   Electronically Signed   By: Ivar Drape M.D.   On: 09/06/2014 11:52    Scheduled Meds: . atorvastatin  40 mg Oral q1800  . cephALEXin  500 mg Oral 4 times per day  . darifenacin  15 mg Oral Daily  . docusate sodium  100 mg Oral BID  . feeding supplement (ENSURE COMPLETE)  237 mL Oral BID BM  . finasteride  5 mg Oral Daily  . metoprolol tartrate  25 mg Oral BID  . pantoprazole  40 mg  Oral Daily  . senna  1 tablet Oral BID  . terazosin  1 mg Oral BID   Continuous Infusions: . dextrose 5% lactated ringers 75 mL/hr at 09/25/14 0551   PRN Meds:.acetaminophen, HYDROcodone-acetaminophen, morphine injection, neomycin-bacitracin-polymyxin, ondansetron, opium-belladonna  Antibiotics.  Anti-infectives   Start     Dose/Rate Route Frequency Ordered Stop   09/25/14 0600  cephALEXin (KEFLEX) capsule 500 mg     500 mg Oral 4 times per day 09/24/14 1342     09/24/14 1800  ceFAZolin (ANCEF) IVPB 2 g/50 mL premix     2 g 100 mL/hr over 30 Minutes Intravenous Every 8 hours 09/24/14 1342 09/25/14 0224   09/24/14 0000  cephALEXin (KEFLEX) 500 MG capsule     500 mg Oral 4 times daily 09/24/14 1203        Time Spent in minutes 55 minutes.  [facility], Tsion Inghram M.D on 09/25/2014 at 3:42 PM  [facility] Office  [phone]

## 2014-09-26 LAB — CBC
HEMATOCRIT: 26.7 % — AB (ref 39.0–52.0)
HEMOGLOBIN: 8.2 g/dL — AB (ref 13.0–17.0)
MCH: 28.1 pg (ref 26.0–34.0)
MCHC: 30.7 g/dL (ref 30.0–36.0)
MCV: 91.4 fL (ref 78.0–100.0)
Platelets: 181 10*3/uL (ref 150–400)
RBC: 2.92 MIL/uL — ABNORMAL LOW (ref 4.22–5.81)
RDW: 14.3 % (ref 11.5–15.5)
WBC: 9.7 10*3/uL (ref 4.0–10.5)

## 2014-09-26 LAB — BASIC METABOLIC PANEL
Anion gap: 10 (ref 5–15)
BUN: 14 mg/dL (ref 6–23)
CHLORIDE: 105 meq/L (ref 96–112)
CO2: 24 mEq/L (ref 19–32)
Calcium: 8.5 mg/dL (ref 8.4–10.5)
Creatinine, Ser: 1.2 mg/dL (ref 0.50–1.35)
GFR calc non Af Amer: 53 mL/min — ABNORMAL LOW (ref >90)
GFR, EST AFRICAN AMERICAN: 61 mL/min — AB (ref >90)
GLUCOSE: 124 mg/dL — AB (ref 70–99)
POTASSIUM: 4.4 meq/L (ref 3.7–5.3)
SODIUM: 139 meq/L (ref 137–147)

## 2014-09-26 NOTE — Clinical Documentation Improvement (Signed)
Current record reflects anemia in the setting of chronic disease, worsened by blood loss secondary to hematuria.  Please specify acuity of the blood loss anemia related to hematuria.     -Acute blood loss anemia -Chronic blood loss anemia -Other -Unable to determine at present  Thank you, Mateo Flow, RN [phone] Clinical Documentation Specialist

## 2014-09-26 NOTE — Progress Notes (Addendum)
2 Days Post-Op Subjective: Continued nausea/spitting up overnight, seems to be more related to coughing up phlegm. Improved this morning. Voided after foley removed. Reports sensation to void but reportedly having difficulty getting to urinal so condom catheter was placed overnight.  No additional bladder/suprapubic pain. Has not worked with PT. Feels tired and weak.  Objective: Vital signs in last 24 hours: Temp:  [97.9 F (36.6 C)-98.5 F (36.9 C)] 98.5 F (36.9 C) (10/06 0430) Pulse Rate:  [66-80] 66 (10/06 0430) Resp:  [18] 18 (10/06 0430) BP: (99-142)/(42-54) 142/49 mmHg (10/06 0430) SpO2:  [90 %-99 %] 99 % (10/06 0430)  Intake/Output from previous day: 10/05 0701 - 10/06 0700 In: 1335 [P.O.:60; I.V.:1275] Out: 602 [Urine:602] Intake/Output this shift:    Physical Exam:  General: Alert and oriented CV: RRR Lungs: Clear Abdomen: Soft, ND, tender to palpation in suprapubic area diffusely GU: urine clear in condom catheter Ext: NT, No erythema  Lab Results:  CBC Latest Ref Rng 09/26/2014 09/25/2014 09/24/2014  WBC 4.0 - 10.5 K/uL 9.7 - -  Hemoglobin 13.0 - 17.0 g/dL 8.2(L) 8.4(L) 9.2(L)  Hematocrit 39.0 - 52.0 % 26.7(L) 26.4(L) 27.0(L)  Platelets 150 - 400 K/uL 181 - -      BMET  Recent Labs  09/25/14 0435 09/26/14 0420  NA 139 139  K 3.8 4.4  CL 107 105  CO2 20 24  GLUCOSE 130* 124*  BUN 12 14  CREATININE 1.11 1.20  CALCIUM 8.2* 8.5     Studies/Results: No results found.  Assessment/Plan: 55M with pT2 bladder cancer diagnosed in 10/2013 who refused definitive treatment initially. He now appears to have locally advanced disease based on CT scan of abdominal/pelvis. No recent chest imaging. Required palliative TURBT for ongoing bleeding and clot retention, as well as right ureteral stent placement as tumor involved right ureteral orifice on 09/24/2014. POD#2 today.  -- Monitor nausea/vomiting, continue zofran PRN -- Advance diet as tolerated as nausea  improves -- D/C antibiotics as no signs/symptoms of infection -- D/C pain meds as may be contributing to nausea/vomiting -- Need to get OOB and ambulate -- Dispo: discharge pending PT eval. SNF vs home    LOS: 2 days   Wynetta Emery, DAVID C 09/26/2014, 7:27 AM     Patient personally seen and examined. I discussed patient with Dr. Wynetta Emery and agree with his assessment and plan. Patient is feeling better today. He is passing a large amount of flatus. He ambulated in the room. He is voiding without difficulty into a condom catheter. Suprapubic pain resolved after Foley removed. He drank a can of ginger ale and a bottle of Ensure this morning. He's had no emesis. On my exam his abdomen is very soft, nontender. Bladder is nontender now that the Foley has been removed. He has bowel sounds. Urine is clear.  Overall he is improved today compared to yesterday. He does still have some nausea but has not had emesis. I discussed my concern with the patient and his daughter a possible bladder perforation. We discussed replacing a catheter and performing a cystogram. He declined. He does not want the Foley back. He feels like he is doing much better. We discussed the risk of continued surveillance and and something they want to continue. He would also like to follow up with me.  I discussed with his daughter that the tumor had come down and was adjacent to the right ureteral orifice. We created a new ureteral orifice with resection and placed a right ureteral stent.  We discussed the stent will need to be removed in a few weeks.

## 2014-09-26 NOTE — Progress Notes (Signed)
INITIAL NUTRITION ASSESSMENT  DOCUMENTATION CODES Per approved criteria  -Not Applicable   INTERVENTION: -Continue Regular diet and encouraged po intake -Continue Ensure Complete po BID, each supplement provides 350 kcal and 13 grams of protein -Provided coupons for supplements to daughter. -RD to follow.  NUTRITION DIAGNOSIS: Inadequate oral intake related to decreased appetite as evidenced by patient report.   Goal: Intake of meals and supplements to meet >90% estimated needs.    Monitor:  Intake, labs, weight trend.  Reason for Assessment: poor po intake consult  80 y.o. male  Admitting Dx: <principal problem not specified>  ASSESSMENT: Patient admitted with bladder cancer with hematuria.    Patient lives with daughter who was in room Poor intake for quite a while. Drinks 1 Ensure a day at home Currently tolerating regular diet but has eaten very little. Mild nausea at times. Weight has decreased 10 lbs in the past 8 months.  Height: Ht Readings from Last 1 Encounters:  09/24/14 5\' 10"  (1.778 m)    Weight: Wt Readings from Last 1 Encounters:  09/24/14 156 lb (70.76 kg)    Ideal Body Weight: 166 lbs  % Ideal Body Weight: 94  Wt Readings from Last 10 Encounters:  09/24/14 156 lb (70.76 kg)  09/24/14 156 lb (70.76 kg)  08/03/14 156 lb (70.761 kg)  02/06/14 166 lb (75.297 kg)  11/01/13 171 lb 6.4 oz (77.747 kg)  10/17/13 172 lb (78.019 kg)  10/10/13 169 lb 12.8 oz (77.021 kg)  07/13/13 171 lb (77.565 kg)  04/12/13 175 lb (79.379 kg)  11/17/12 176 lb 12.9 oz (80.2 kg)    Usual Body Weight: 166 lbs 8 months ago  % Usual Body Weight: 94  BMI:  Body mass index is 22.38 kg/(m^2).  Estimated Nutritional Needs: Kcal: 1900-2000  Protein: 85-95 gm Fluid: 1.9-2L daily  Skin: intact  Diet Order: General  EDUCATION NEEDS: -Education needs addressed   Intake/Output Summary (Last 24 hours) at 09/26/14 1433 Last data filed at 09/26/14 1300  Gross  per 24 hour  Intake   2145 ml  Output    202 ml  Net   1943 ml    Labs:   Recent Labs Lab 09/24/14 0418 09/25/14 0435 09/26/14 0420  NA 139 139 139  K 3.9 3.8 4.4  CL 106 107 105  CO2  --  20 24  BUN 12 12 14   CREATININE 1.10 1.11 1.20  CALCIUM  --  8.2* 8.5  GLUCOSE 94 130* 124*    CBG (last 3)  No results found for this basename: GLUCAP,  in the last 72 hours  Scheduled Meds: . atorvastatin  40 mg Oral q1800  . darifenacin  15 mg Oral Daily  . docusate sodium  100 mg Oral BID  . feeding supplement (ENSURE COMPLETE)  237 mL Oral BID BM  . ferrous sulfate  300 mg Oral BID WC  . finasteride  5 mg Oral Daily  . metoprolol tartrate  25 mg Oral BID  . pantoprazole  40 mg Oral Daily  . polyethylene glycol  17 g Oral Daily  . senna  1 tablet Oral BID  . senna-docusate  1 tablet Oral BID  . terazosin  1 mg Oral BID    Continuous Infusions:   Past Medical History  Diagnosis Date  . ASHD (arteriosclerotic heart disease)   . COPD (chronic obstructive pulmonary disease)   . GERD (gastroesophageal reflux disease)   . BPH (benign prostatic hyperplasia)   . Stroke 01/1999    "  mild" (11/16/2012)  . Hypertension   . Hypercholesteremia   . Exertional dyspnea   . S/P angioplasty with stent, to high grade Lt external iliac atery  11/16/12 11/17/2012  . Hematoma of groin, at cath site 11/17/2012  . Acute blood loss anemia, lt groin hematoma 11/17/2012  . CAD (coronary artery disease)   . S/P CABG (coronary artery bypass graft) 2006  . PAD (peripheral artery disease)     Past Surgical History  Procedure Laterality Date  . Cholecystectomy open    . Iliac artery stent  11/16/2012    95% focal stenosis in L external iliac artery, stented w/ a 72mmx3cm Cordis Smart Nitinol self-expanding stent, resulting in reduction of a 95% stenosis to 0% residual.  . Colectomy  ~ 2006    "partial" (11/16/2012)  . Lea doppler  12/17/2012    Normal  . Cardiovascular stress test   10/15/2012    Lexiscan. Basal to Mid Inferolateral bowel artifact. No Lexiscan EKG changes, non-diagnostic for ischemia. No reversible ischemia.  . Transthoracic echocardiogram  04/12/2013    EF 55-60%, LA moderately dilated,   . Femoral-femoral bypass graft    . Eye surgery      bilateral cataracts  . Hernia repair      left  . Transurethral resection of bladder tumor with gyrus (turbt-gyrus) N/A 11/04/2013    Procedure: TRANSURETHRAL RESECTION OF BLADDER TUMOR WITH GYRUS (TURBT-GYRUS);  Surgeon: Claybon Jabs, MD;  Location: [facility];  Service: Urology;  Laterality: N/A;  . Transurethral resection of bladder tumor N/A 09/24/2014    Procedure: TRANSURETHRAL RESECTION OF BLADDER TUMOR (TURBT) GREATER THAN 5 CM;  Surgeon: Festus Aloe, MD;  Location: [facility];  Service: Urology;  Laterality: N/A;  . Cystoscopy Right 09/24/2014    Procedure: CYSTOSCOPY WITH CLOT EVACUATION, RIGHT RETROGRADE, RIGHT URETERAL STENT PLACEMENT;  Surgeon: Festus Aloe, MD;  Location: [facility];  Service: Urology;  Laterality: Right;    Antonieta Iba, RD, LDN Clinical Inpatient Dietitian Pager:  [phone] Weekend and after hours pager:  [phone]

## 2014-09-26 NOTE — Progress Notes (Signed)
Patient ID: Julian Hale, male   DOB: Aug 22, 1926, 80 y.o.   MRN: [id number]  [facility] PROGRESS NOTE  Quint Chestnut LFY:[id number] DOB: 1926/06/23 DOA: 09/24/2014 PCP: Wyatt Haste, MD  Brief narrative: 80 y.o. male with history of muscle invasive bladder cancer diagnosed in 10/2013 by Dr. Karsten Ro. Definitive treatment was recommended, however pt refused at that time. He was seen in the ER about 2 weeks ago with hematuria and clots and had his bladder hand irrigated but was not able to tolerate the foley for more than 24 hours and had this removed. Since then, he has had significant hematuria with clot passage and suprapubic pain. [facility] consulted for medical management.   Assessment and Plan: Bladder cancer with hematuria:  - Appears to be having locally advanced cancer, status post palliative TURBT for ongoing bleeding and clot retention, as well as right ureteral stent placement as tumor involved right ureteral orifice on 09/24/2014  - Management as per primary neurology team.  History of carotid artery disease, peripheral vascular disease  - Patient denies any chest pain, shortness of breath, claudication, no anticoagulation at this point given his hematuria.  - dose of metoprolol decreased due to soft BP Acute blood loss anemia  -This is most likely in the setting of anemia of chronic disease, worsened by blood loss secondary to hematuria.  - At this point there is no indication for transfusion, start on iron supplement.  History of COPD  - Patient has no active wheezing, keep on when necessary albuterol.  Weakness/debilitation/malnutrition  - PT consult, to go to chair, nutrition supplement, nutrition consult  Dyslipidemia  - Continue statin.    DVT Prophylaxis SCDs   Code Status: Full Family Communication: Wife at bedside Disposition Plan: Home when medically stable  IV Access:   Peripheral IV Procedures and diagnostic studies:    No results found.   Medical Consultants:   [facility] consulting team  Other Consultants:   Physical therapy  Anti-Infectives:   None   Faye Ramsay, MD  [facility] Pager [phone]  If 7PM-7AM, please contact night-coverage [email or web address] Password [email or web address] 09/26/2014, 6:42 PM   LOS: 2 days   HPI/Subjective: No events overnight.   Objective: Filed Vitals:   09/25/14 1318 09/25/14 2248 09/26/14 0430 09/26/14 1318  BP: 99/42 131/50 142/49 129/47  Pulse: 67 80 66 68  Temp: 97.9 F (36.6 C) 98 F (36.7 C) 98.5 F (36.9 C) 97.7 F (36.5 C)  TempSrc: Oral Oral Oral Oral  Resp: 18 18 18 18   Height:      Weight:      SpO2: 90% 97% 99% 98%    Intake/Output Summary (Last 24 hours) at 09/26/14 1842 Last data filed at 09/26/14 1800  Gross per 24 hour  Intake   1620 ml  Output    200 ml  Net   1420 ml    Exam:   General:  Pt is alert, follows commands appropriately, not in acute distress  Cardiovascular: Regular rate and rhythm, no rubs, no gallops  Respiratory: Clear to auscultation bilaterally, no wheezing, no crackles, no rhonchi  Abdomen: Soft, non tender, non distended, bowel sounds present, no guarding  Data Reviewed: Basic Metabolic Panel:  Recent Labs Lab 09/24/14 0418 09/25/14 0435 09/26/14 0420  NA 139 139 139  K 3.9 3.8 4.4  CL 106 107 105  CO2  --  20 24  GLUCOSE 94 130* 124*  BUN 12 12 14   CREATININE 1.10 1.11 1.20  CALCIUM  --  8.2* 8.5  CBC:  Recent Labs Lab 09/24/14 0411 09/24/14 0418 09/25/14 0435 09/26/14 0420  WBC 7.8  --   --  9.7  NEUTROABS 5.2  --   --   --   HGB 9.0* 9.2* 8.4* 8.2*  HCT 27.6* 27.0* 26.4* 26.7*  MCV 88.2  --   --  91.4  PLT 195  --   --  181   Scheduled Meds: . atorvastatin  40 mg Oral q1800  . darifenacin  15 mg Oral Daily  . docusate sodium  100 mg Oral BID  . feeding supplement (ENSURE COMPLETE)  237 mL Oral BID BM  . ferrous sulfate  300 mg Oral BID WC  . finasteride  5 mg Oral Daily  . metoprolol tartrate  25 mg Oral BID   . pantoprazole  40 mg Oral Daily  . polyethylene glycol  17 g Oral Daily  . senna  1 tablet Oral BID  . senna-docusate  1 tablet Oral BID  . terazosin  1 mg Oral BID   Continuous Infusions:

## 2014-09-26 NOTE — Progress Notes (Signed)
Clinical Social Work Department BRIEF PSYCHOSOCIAL ASSESSMENT 09/26/2014  Patient:  Julian Hale, Julian Hale     Account Number:  [id number]     Admit date:  09/24/2014  Clinical Social Worker:  Renold Genta  Date/Time:  09/26/2014 03:28 PM  Referred by:  Physician  Date Referred:  09/26/2014 Referred for  SNF Placement   Other Referral:   Interview type:  Patient Other interview type:    PSYCHOSOCIAL DATA Living Status:  WITH ADULT CHILDREN Admitted from facility:   Level of care:   Primary support name:  Sarim Rothman [phone] Primary support relationship to patient:  CHILD, ADULT Degree of support available:   Good    CURRENT CONCERNS Current Concerns  Post-Acute Placement   Other Concerns:    SOCIAL WORK ASSESSMENT / PLAN CSW received consult from PT regarding SNF recommendation upon DC.   Assessment/plan status:  Information/Referral to [employer] Other assessment/ plan:   Information/referral to community resources:   CSW completed FL2 and faxed information out to [employer]. Provided patient with a list of SNFs.    PATIENT'S/FAMILY'S RESPONSE TO PLAN OF CARE: Patient informed CSW that he lives home with his daughter Langley Gauss) who is on disability. Patient agreeable with plans for SNF. CSW left a message for daughter, awaiting call back. CSW will follow up with bed offers in the morning.         Raynaldo Opitz, [facility] Clinical Social Worker cell #: [phone]

## 2014-09-26 NOTE — Progress Notes (Signed)
Patient continued with nausea and vomiting around bedtime.  Medicated with prn Zofran but patient still vomited after giving bedtime meds.  Monitored through night and pt denies any nausea this am.

## 2014-09-26 NOTE — Evaluation (Signed)
Physical Therapy Evaluation Patient Details Name: Julian Hale MRN: [id number] DOB: 01-25-1926 Today's Date: 09/26/2014   History of Present Illness  80 yo male adm 09/24/14 for hematuria, bladder CA; PMHx: CAD, bladder CA, HTN  Clinical Impression  Pt will benefit from PT to address deficits below, he is currently very deconditioned; Pt was quite independent at his baseline, does live with daughter (she works but is currently out of work d/t hand surgery); Pt will likely benefit from post acute therapy at SNF level; His dtr states she is very concerned about him going home at his current status; Will continue to follow acutely     Follow Up Recommendations SNF;Supervision/Assistance - 24 hour    Equipment Recommendations  None recommended by PT    Recommendations for Other Services       Precautions / Restrictions Precautions Precautions: Fall      Mobility  Bed Mobility Overal bed mobility: Needs Assistance Bed Mobility: Supine to Sit     Supine to sit: Min assist     General bed mobility comments: requires incr time and assist to bring trunk forward, cues for wt shift to scoot to EOB  Transfers Overall transfer level: Needs assistance Equipment used: Rolling walker (2 wheeled) Transfers: Sit to/from Stand Sit to Stand: Min assist         General transfer comment: verbal cues for anterior-superior wt shift, hand placement and to control descent  Ambulation/Gait Ambulation/Gait assistance: Min guard;Min assist Ambulation Distance (Feet): 54 Feet Assistive device: Rolling walker (2 wheeled) Gait Pattern/deviations: Step-to pattern;Step-through pattern;Decreased stride length     General Gait Details: cues for RW postion/distance from self, posture and safety with turns  Stairs            Wheelchair Mobility    Modified Rankin (Stroke Patients Only)       Balance Overall balance assessment: Needs assistance           Standing  balance-Leahy Scale: Poor Standing balance comment: needs support of RW to maintain safe static stand                             Pertinent Vitals/Pain Pain Assessment: Faces Faces Pain Scale: Hurts a little bit Pain Location: with standing    Home Living Family/patient expects to be discharged to:: Private residence Living Arrangements: Children Available Help at Discharge: Family (dtr is on disability from hand surgery) Type of Home: House Home Access: Stairs to enter   [employer] of Steps: 1 Home Layout: One level Home Equipment: Walker - 2 wheels;Cane - single point      Prior Function Level of Independence: Independent         Comments: having urgency and  difficulty getting to bathroom at home prior to adm, per dtr     Hand Dominance        Extremity/Trunk Assessment   Upper Extremity Assessment: Generalized weakness           Lower Extremity Assessment: Generalized weakness         Communication   Communication: HOH  Cognition Arousal/Alertness: Awake/alert Behavior During Therapy: WFL for tasks assessed/performed Overall Cognitive Status: Impaired/Different from baseline Area of Impairment: Safety/judgement;Orientation;Problem solving Orientation Level: Time       Safety/Judgement: Decreased awareness of safety   Problem Solving: Requires verbal cues;Slow processing      General Comments      Exercises  Assessment/Plan    PT Assessment Patient needs continued PT services  PT Diagnosis Difficulty walking;Generalized weakness   PT Problem List Decreased strength;Decreased activity tolerance;Decreased balance;Decreased mobility;Decreased knowledge of use of DME;Decreased safety awareness  PT Treatment Interventions DME instruction;Gait training;Functional mobility training;Therapeutic activities;Therapeutic exercise;Patient/family education   PT Goals (Current goals can be found in the Care Plan section)  Acute Rehab PT Goals Patient Stated Goal: to be I again per family, dtr PT Goal Formulation: With patient Time For Goal Achievement: 10/03/14 Potential to Achieve Goals: Good    Frequency Min 3X/week   Barriers to discharge        Co-evaluation               End of Session Equipment Utilized During Treatment: Gait belt Activity Tolerance: Patient limited by fatigue Patient left: in bed;with call bell/phone within reach;with family/visitor present Nurse Communication: Mobility status         Time: 8101-7510 PT Time Calculation (min): 23 min   Charges:   PT Evaluation $Initial PT Evaluation Tier I: 1 Procedure PT Treatments $Gait Training: 23-37 mins   PT G Codes:          Zola Runion 10-15-2014, 2:20 PM

## 2014-09-26 NOTE — Progress Notes (Signed)
Clinical Social Work Department CLINICAL SOCIAL WORK PLACEMENT NOTE 09/26/2014  Patient:  Julian Hale, Julian Hale  Account Number:  [id number] Admit date:  09/24/2014  Clinical Social Worker:  Renold Genta  Date/time:  09/26/2014 03:33 PM  Clinical Social Work is seeking post-discharge placement for this patient at the following level of care:   SKILLED NURSING   (*CSW will update this form in Epic as items are completed)   09/26/2014  Patient/family provided with [place] Department of Clinical Social Work's list of facilities offering this level of care within the geographic area requested by the patient (or if unable, by the patient's family).  09/26/2014  Patient/family informed of their freedom to choose among providers that offer the needed level of care, that participate in Medicare, Medicaid or managed care program needed by the patient, have an available bed and are willing to accept the patient.  09/26/2014  Patient/family informed of [facility]' ownership interest in [facility], as well as of the fact that they are under no obligation to receive care at this facility.  PASARR submitted to EDS on 09/26/2014 PASARR number received on 09/26/2014  FL2 transmitted to all facilities in geographic area requested by pt/family on  09/26/2014 FL2 transmitted to all facilities within larger geographic area on   Patient informed that his/her managed care company has contracts with or will negotiate with  certain facilities, including the following:     Patient/family informed of bed offers received:   Patient chooses bed at  Physician recommends and patient chooses bed at    Patient to be transferred to  on   Patient to be transferred to facility by  Patient and family notified of transfer on  Name of family member notified:    The following physician request were entered in Epic:   Additional Comments:   Raynaldo Opitz, [place] Social Worker cell #: [phone]

## 2014-09-27 LAB — BASIC METABOLIC PANEL
ANION GAP: 10 (ref 5–15)
BUN: 14 mg/dL (ref 6–23)
CHLORIDE: 100 meq/L (ref 96–112)
CO2: 26 meq/L (ref 19–32)
Calcium: 8.7 mg/dL (ref 8.4–10.5)
Creatinine, Ser: 1.07 mg/dL (ref 0.50–1.35)
GFR calc Af Amer: 70 mL/min — ABNORMAL LOW (ref >90)
GFR calc non Af Amer: 61 mL/min — ABNORMAL LOW (ref >90)
Glucose, Bld: 96 mg/dL (ref 70–99)
Potassium: 4.3 mEq/L (ref 3.7–5.3)
Sodium: 136 mEq/L — ABNORMAL LOW (ref 137–147)

## 2014-09-27 LAB — CBC
HCT: 27.7 % — ABNORMAL LOW (ref 39.0–52.0)
HEMOGLOBIN: 8.8 g/dL — AB (ref 13.0–17.0)
MCH: 29 pg (ref 26.0–34.0)
MCHC: 31.8 g/dL (ref 30.0–36.0)
MCV: 91.4 fL (ref 78.0–100.0)
PLATELETS: 183 10*3/uL (ref 150–400)
RBC: 3.03 MIL/uL — AB (ref 4.22–5.81)
RDW: 14.1 % (ref 11.5–15.5)
WBC: 10.5 10*3/uL (ref 4.0–10.5)

## 2014-09-27 MED ORDER — METOPROLOL TARTRATE 25 MG PO TABS: 25.0000 mg | ORAL_TABLET | Freq: Two times a day (BID) | ORAL | Status: DC

## 2014-09-27 MED ORDER — BACITRACIN-NEOMYCIN-POLYMYXIN 400-5-5000 EX OINT: 1.0000 "application " | TOPICAL_OINTMENT | Freq: Three times a day (TID) | CUTANEOUS | Status: DC | PRN

## 2014-09-27 MED ORDER — ENSURE COMPLETE PO LIQD: 237.0000 mL | Freq: Two times a day (BID) | ORAL | Status: DC

## 2014-09-27 MED ORDER — FERROUS SULFATE 325 (65 FE) MG PO TBEC: 325.0000 mg | DELAYED_RELEASE_TABLET | Freq: Every day | ORAL | Status: DC

## 2014-09-27 MED ORDER — ENSURE COMPLETE PO LIQD: 237.0000 mL | Freq: Three times a day (TID) | ORAL | Status: DC

## 2014-09-27 MED ORDER — FINASTERIDE 5 MG PO TABS: 5.0000 mg | ORAL_TABLET | Freq: Every day | ORAL | Status: AC

## 2014-09-27 MED ORDER — ALBUTEROL SULFATE (2.5 MG/3ML) 0.083% IN NEBU: 2.5000 mg | INHALATION_SOLUTION | RESPIRATORY_TRACT | Status: DC | PRN

## 2014-09-27 NOTE — Progress Notes (Signed)
Report called to [facility] and given to [place]. AVS packet sent with pt. Pt to be transported via [facility].

## 2014-09-27 NOTE — Progress Notes (Addendum)
3 Days Post-Op Subjective: Feels much better this am after 2 large bowel movements. Nausea resolved. Voiding into a urinal. Tolerating PO (liquids > solids) but essentially at his baseline level of PO intake. No pain, fevers, or chills. Much more upbeat and energetic this am.  Objective: Vital signs in last 24 hours: Temp:  [97.7 F (36.5 C)-98.6 F (37 C)] 98 F (36.7 C) (10/07 0441) Pulse Rate:  [66-84] 66 (10/07 0441) Resp:  [18-20] 20 (10/07 0441) BP: (126-129)/(32-47) 126/41 mmHg (10/07 0441) SpO2:  [93 %-98 %] 93 % (10/07 0441)  Intake/Output from previous day: 10/06 0701 - 10/07 0700 In: 420 [P.O.:420] Out: 550 [Urine:550] Intake/Output this shift: Total I/O In: 120 [P.O.:120] Out: 50 [Urine:50]  Physical Exam:  General: Alert and oriented CV: RRR Lungs: Clear Abdomen: Soft, ND, non-tender. No rebound or guarding. GU: urine clear in urinal Ext: NT, No erythema  Lab Results:  CBC Latest Ref Rng 09/27/2014 09/26/2014 09/25/2014  WBC 4.0 - 10.5 K/uL 10.5 9.7 -  Hemoglobin 13.0 - 17.0 g/dL 8.8(L) 8.2(L) 8.4(L)  Hematocrit 39.0 - 52.0 % 27.7(L) 26.7(L) 26.4(L)  Platelets 150 - 400 K/uL 183 181 -      BMET  Recent Labs  09/26/14 0420 09/27/14 0430  NA 139 136*  K 4.4 4.3  CL 105 100  CO2 24 26  GLUCOSE 124* 96  BUN 14 14  CREATININE 1.20 1.07  CALCIUM 8.5 8.7     Studies/Results: No results found.  Assessment/Plan: 57M with pT2 bladder cancer diagnosed in 10/2013 who refused definitive treatment initially. He now appears to have locally advanced disease based on CT scan of abdominal/pelvis. No recent chest imaging. Required palliative TURBT for ongoing bleeding and clot retention, as well as right ureteral stent placement as tumor involved right ureteral orifice on 09/24/2014. Muscle invasive bladder cancer again on resection. POD#3 today. Overall improving.  -- Continue regular diet -- Appreciate nutrition and PT consultations, as well as social work  assistance with placement, likely SNF. Appropriate for discharge from urologic standpoint. -- Will follow up with Dr. Junious Silk for follow up. Will need stent removal and referral to medical oncology for discussion of chemotherapy vs chemo/rads.   LOS: 3 days   Julian Hale, DAVID C 09/27/2014, 11:17 AM   Patient personally seen and examined. I discussed the patient with Dr. Wynetta Emery and agree with his assessment and plan. Abdomen is soft and nontender.  He's had multiple bowel movements today and the nausea has resolved.  I suspect he had an ileus.  He is tolerating a regular diet and ambulating without difficulty.  Urine has remained clear.stable for discharge.

## 2014-09-27 NOTE — Discharge Summary (Signed)
Physician Discharge Summary  Julian Hale HER:[id number] DOB: [birth year] DOA: 09/24/2014  PCP: Wyatt Haste, MD  Admit date: 09/24/2014 Discharge date: 09/27/2014  Recommendations for Outpatient Follow-up:  1. Urology follow up with Dr. Junious Silk in 1-2 weeks for stent removal and ongoing surveillance 2. To SNF for ongoing PT/OT 3. Repeat BMP and CBC in 1-2 weeks  Discharge Diagnoses:  Active Problems:   GERD (gastroesophageal reflux disease)   CAD, CABG in [place] 2006. OP Myoview low risk 10/15/12   Dyslipidemia   COPD on exam (remote smoker)   Bladder cancer   Anemia   Weakness   Discharge Condition: stable, improved   Diet recommendation: regular  Wt Readings from Last 3 Encounters:  09/24/14 70.76 kg (156 lb)  09/24/14 70.76 kg (156 lb)  08/03/14 70.761 kg (156 lb)    History of present illness:  Julian Hale is a 80 y.o. male with history of muscle invasive bladder cancer diagnosed in 10/2013 by Dr. Karsten Ro. Definitive treatment was recommended, however he refused at that time. He was seen in the ER about 2 weeks prior to admission with hematuria and clots.  He had his bladder hand irrigated but was not able to tolerate the foley for more than 24 hours so it was removed.  He continued to have significant hematuria with clot passage and suprapubic pain and was admitted for ongoing management.   Hospital Course:  Bladder cancer with hematuria.  He was started on empiric antibiotics, however, these were discontinued by Urology who felt his symptoms were due to his malignancy and infection was less likely.  He underwent cystoscopy with TURBT > 5cm, right retrograde pyelogram and right ureteral stent placement by Dr. Junious Silk on 10/4.  He appeared to be have locally advanced cancer which was bleeding.  Post procedure, his hematuria appeared to improve and he was able to void spontaneously.  He declined foley placement.  He will need to follow up with Urology in 1-2 weeks  for stent removal and ongoing management of his bladder cancer.    History of carotid artery disease, peripheral vascular disease, remained stable.  His metoprolol dose was decreased slightly due to mildly low blood pressures.    Acute blood loss anemia due to hematuria and superimposed on anemia of chronic disease.  He did not require blood transfusion and was started on iron supplementation.  His hemoglobin stabilized around mid 8mg /dl.   History of COPD, stable, continued prn albuterol.  Patient declines additional controller medications.  Would avoid spiriva/atrovent due to side effect of urinary retention.  Consider addition of ICS/LABA if breathing worsens.   Weakness/debilitation/malnutrition.  He was seen by nutrition who recommended supplements, PT/OT who recommended SNF for rehabilitation.    Dyslipidemia, stable, continued statin.    Procedures: He underwent cystoscopy with TURBT > 5cm, right retrograde pyelogram and right ureteral stent placement by Dr. Junious Silk on 10/4  Consultations:  Urology, Dr. Junious Silk  Discharge Exam: Filed Vitals:   09/27/14 0441  BP: 126/41  Pulse: 66  Temp: 98 F (36.7 C)  Resp: 20   Filed Vitals:   09/26/14 0430 09/26/14 1318 09/26/14 2149 09/27/14 0441  BP: 142/49 129/47 129/32 126/41  Pulse: 66 68 84 66  Temp: 98.5 F (36.9 C) 97.7 F (36.5 C) 98.6 F (37 C) 98 F (36.7 C)  TempSrc: Oral Oral Oral Oral  Resp: 18 18 20 20   Height:      Weight:      SpO2: 99% 98% 94% 93%  General: WM, NAD Cardiovascular: RRR, no mrg, 2+ pulses Respiratory: full expiratory wheeze, no rales or rhonchi, no increased WOB  ABD:  NABS, soft, ND/NT MSK:  No LEE, normal tone, decrease muscle bulk Neuro:  Grossly moves all extremities  Discharge Instructions      Discharge Instructions   Call MD for:  difficulty breathing, headache or visual disturbances    Complete by:  As directed      Call MD for:  extreme fatigue    Complete by:  As  directed      Call MD for:  hives    Complete by:  As directed      Call MD for:  persistant dizziness or light-headedness    Complete by:  As directed      Call MD for:  persistant nausea and vomiting    Complete by:  As directed      Call MD for:  severe uncontrolled pain    Complete by:  As directed      Call MD for:  temperature >100.4    Complete by:  As directed      Diet general    Complete by:  As directed      Increase activity slowly    Complete by:  As directed             Medication List         albuterol (2.5 MG/3ML) 0.083% nebulizer solution  Commonly known as:  PROVENTIL  Take 3 mLs (2.5 mg total) by nebulization every 4 (four) hours as needed for wheezing or shortness of breath.     feeding supplement (ENSURE COMPLETE) Liqd  Take 237 mLs by mouth 3 (three) times daily between meals.     ferrous sulfate 325 (65 FE) MG EC tablet  Take 1 tablet (325 mg total) by mouth daily with breakfast.     finasteride 5 MG tablet  Commonly known as:  PROSCAR  Take 1 tablet (5 mg total) by mouth daily.     HYDROcodone-acetaminophen 5-325 MG per tablet  Commonly known as:  NORCO  Take 1 tablet by mouth every 6 (six) hours as needed for moderate pain.     isosorbide mononitrate 30 MG 24 hr tablet  Commonly known as:  IMDUR  Take 1 tablet (30 mg total) by mouth every morning.     metoprolol tartrate 25 MG tablet  Commonly known as:  LOPRESSOR  Take 1 tablet (25 mg total) by mouth 2 (two) times daily.     neomycin-bacitracin-polymyxin ointment  Commonly known as:  NEOSPORIN  Apply 1 application topically 3 (three) times daily as needed for wound care (catheter irritation). apply to urethral meatus only     omeprazole 20 MG capsule  Commonly known as:  PRILOSEC  Take 20 mg by mouth daily.     simvastatin 80 MG tablet  Commonly known as:  ZOCOR  Take 40 mg by mouth daily.     solifenacin 10 MG tablet  Commonly known as:  VESICARE  Take 10 mg by mouth daily.      terazosin 1 MG capsule  Commonly known as:  HYTRIN  Take 1 mg by mouth 2 (two) times daily.       Follow-up Information   Follow up with Claybon Jabs, MD. Schedule an appointment as soon as possible for a visit in 2 weeks.   Specialty:  Urology   Contact information:   [place] [address] [phone]  Follow up with Festus Aloe, MD. Schedule an appointment as soon as possible for a visit in 2 weeks.   Specialty:  Urology   Contact information:   [place] [address] [phone]        The results of significant diagnostics from this hospitalization (including imaging, microbiology, ancillary and laboratory) are listed below for reference.    Significant Diagnostic Studies: Ct Abdomen Pelvis Wo Contrast  09/24/2014   CLINICAL DATA:  Hematuria and pain with urination. Known bladder cancer, not treated.  EXAM: CT ABDOMEN AND PELVIS WITHOUT CONTRAST  TECHNIQUE: Multidetector CT imaging of the abdomen and pelvis was performed following the standard protocol without IV contrast.  COMPARISON:  09/09/2014  FINDINGS: The lung bases are clear.  Moderate-sized esophageal hiatal hernia.  Surgical absence of the gallbladder. Unenhanced appearance of the liver, spleen, pancreas, adrenal glands, inferior vena cava, and retroperitoneal lymph nodes is unremarkable. Calcification of the abdominal aorta without aneurysm. Kidneys demonstrate multiple masses shown to represent cysts on prior studies. No hydronephrosis in either kidney. Stomach, small bowel, and colon are not abnormally distended. No free air or free fluid in the abdomen.  The bladder is incompletely distended. Again, there is asymmetric bladder wall thickening with a large mass in the bladder. Increased density suggest hemorrhage. Some of this may represent blood clots adjacent to the mass. Note gas in the bladder. There is bladder wall thickening antral laterally towards the right with  infiltration in the surrounding fat, likely indicating direct invasion of the surrounding fat. No significant lymphadenopathy in the pelvis. Appendix is normal. Surgical clips in the pelvis. Vascular calcifications and stent in the left external iliac artery. Femoral femoral bypass graft. Left inguinal hernia containing fat. Old fracture deformity of the right inferior pubic ramus. Compression fractures of L3 and L4, unchanged since previous study. No destructive bone lesions.  IMPRESSION: The most significant finding is a large mass and blood clot in the bladder with asymmetric bladder wall thickening to the right anteriorly. Probable direct invasion in the pelvic fat around the bladder. No significant lymphadenopathy. Multiple incidental findings as discussed.   Electronically Signed   By: Lucienne Capers M.D.   On: 09/24/2014 06:18   Ct Abdomen Pelvis W Contrast  09/09/2014   CLINICAL DATA:  Hematuria.  Dysuria.  History of bladder cancer.  EXAM: CT ABDOMEN AND PELVIS WITH CONTRAST  TECHNIQUE: Multidetector CT imaging of the abdomen and pelvis was performed using the standard protocol following bolus administration of intravenous contrast.  CONTRAST:  72mL OMNIPAQUE IOHEXOL 300 MG/ML SOLN, 168mL OMNIPAQUE IOHEXOL 300 MG/ML SOLN  COMPARISON:  10/21/2013  FINDINGS: Lower chest: Moderate-sized hiatal hernia with 1.0 cm in Jolene Guyett axis lymph node or small fluid collection adjacent to the hiatal hernia on the right side.  Hepatobiliary: Prior cholecystectomy.  Otherwise negative.  Spleen: Intact  Pancreas: Intact  Stomach/Bowel: Scattered colonic diverticula. No dilated bowel. Terminal ileum unremarkable. Appendix absent.  Adrenals/urinary tract: There is an 8.2 x 4.1 cm high density filling defect to the right side of the urinary bladder compatible with tumor or blood clot. This has heterogeneous internal density. Tumor is favored. The hematuria protocol was not utilized. No compelling evidence of other enhancement  along the urothelium. Bilateral renal cysts are present; a variety of the tiny renal hypodense lesions are technically too small to characterize.  Vascular/Lymphatic: Aortoiliac atherosclerotic vascular disease. 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 A fem-fem bypass  is patent. I do not see definite pathologic pelvic adenopathy.  Reproductive: Prostate gland and seminal vesicles not well seen.  Musculoskeletal: Old right pubic ramus fractures, healed. I do not observe definite osseous metastatic disease. Bony demineralization. Superior endplate compression fractures at L3 and L4 are similar to prior. Left inguinal hernia contain small bowel and adipose tissue, without findings of strangulation or obstruction.  Other: None  IMPRESSION: 1. Enlargement of the right bladder tumor, currently 8.2 x 4.1 cm. Some of this density could represent blood clot adjacent to the known tumor. 2. Moderate-sized hiatal hernia. 3. Remote superior endplate compression fractures at L3 and L4. 4. Left inguinal hernia contains adipose tissue and nondilated/non-strangulated small bowel.   Electronically Signed   By: Sherryl Barters M.D.   On: 09/09/2014 12:27   Dg Esophagus  09/06/2014   CLINICAL DATA:  Dysphagia  EXAM: ESOPHOGRAM/BARIUM SWALLOW  TECHNIQUE: Single contrast examination was performed using  thin barium.  FLUOROSCOPY TIME:  0 min 54 seconds  COMPARISON:  None.  FINDINGS: With the patient's history of coughing after swallowing, the study was begun on the lateral projection. There is some penetration of barium, but no aspiration is seen. There is a prominent cricopharyngeus muscle noted with a is small Zenker's diverticulum present. A small Zenker's diverticulum was described on the dictated report from 07/04/2003. Mild tertiary contractions are noted in the mid and distal esophagus. A small to moderate size hiatal hernia is present. Moderate gastroesophageal reflux  is demonstrated. At the referring physician's request, no barium pill was given.  IMPRESSION: 1. Small to moderate size hiatal hernia with moderate gastroesophageal reflux. 2. Prominent cricopharyngeus muscle with small Zenker's diverticulum, which was described on the prior dictated report from barium swallow of 2004.   Electronically Signed   By: Ivar Drape M.D.   On: 09/06/2014 11:52    Microbiology: No results found for this or any previous visit (from the past 240 hour(s)).   Labs: Basic Metabolic Panel:  Recent Labs Lab 09/24/14 0418 09/25/14 0435 09/26/14 0420 09/27/14 0430  NA 139 139 139 136*  K 3.9 3.8 4.4 4.3  CL 106 107 105 100  CO2  --  20 24 26   GLUCOSE 94 130* 124* 96  BUN 12 12 14 14   CREATININE 1.10 1.11 1.20 1.07  CALCIUM  --  8.2* 8.5 8.7   Liver Function Tests: No results found for this basename: AST, ALT, ALKPHOS, BILITOT, PROT, ALBUMIN,  in the last 168 hours No results found for this basename: LIPASE, AMYLASE,  in the last 168 hours No results found for this basename: AMMONIA,  in the last 168 hours CBC:  Recent Labs Lab 09/24/14 0411 09/24/14 0418 09/25/14 0435 09/26/14 0420 09/27/14 0430  WBC 7.8  --   --  9.7 10.5  NEUTROABS 5.2  --   --   --   --   HGB 9.0* 9.2* 8.4* 8.2* 8.8*  HCT 27.6* 27.0* 26.4* 26.7* 27.7*  MCV 88.2  --   --  91.4 91.4  PLT 195  --   --  181 183   Cardiac Enzymes: No results found for this basename: CKTOTAL, CKMB, CKMBINDEX, TROPONINI,  in the last 168 hours BNP: BNP (last 3 results) No results found for this basename: PROBNP,  in the last 8760 hours CBG: No results found for this basename: GLUCAP,  in the last 168 hours  Time coordinating discharge: 35 minutes  Signed:  Donye Dauenhauer  [facility] 09/27/2014, 11:35 AM

## 2014-09-27 NOTE — Progress Notes (Signed)
Patient is set to discharge to [facility] SNF today. Patient & daughter Julian Hale) at bedside aware. [employer] insurance authorization obtained. Discharge packet given to RN, Julian Hale. [facility] called for transport.   Clinical Social Work Department CLINICAL SOCIAL WORK PLACEMENT NOTE 09/27/2014  Patient:  Julian Hale, Julian Hale  Account Number:  [id number] Admit date:  09/24/2014  Clinical Social Worker:  Renold Genta  Date/time:  09/26/2014 03:33 PM  Clinical Social Work is seeking post-discharge placement for this patient at the following level of care:   SKILLED NURSING   (*CSW will update this form in Epic as items are completed)   09/26/2014  Patient/family provided with [place] Department of Clinical Social Work's list of facilities offering this level of care within the geographic area requested by the patient (or if unable, by the patient's family).  09/26/2014  Patient/family informed of their freedom to choose among providers that offer the needed level of care, that participate in Medicare, Medicaid or managed care program needed by the patient, have an available bed and are willing to accept the patient.  09/26/2014  Patient/family informed of [facility]' ownership interest in [facility], as well as of the fact that they are under no obligation to receive care at this facility.  PASARR submitted to E[employer] on 09/26/2014 PASARR number received on 09/26/2014  FL2 transmitted to all facilities in geographic area requested by pt/family on  09/26/2014 FL2 transmitted to all facilities within larger geographic area on   Patient informed that his/her managed care company has contracts with or will negotiate with  certain facilities, including the following:     Patient/family informed of bed offers received:  09/27/2014 Patient chooses bed at [facility] Physician recommends and patient chooses bed at    Patient to be transferred to [facility] on   09/27/2014 Patient to be transferred to facility by [facility] Patient and family notified of transfer on 09/27/2014 Name of family member notified:  patient's daughter, Julian Hale at bedside  The following physician request were entered in Epic:   Additional Comments:   Raynaldo Opitz, [place] Social Worker cell #: [phone]

## 2014-09-28 ENCOUNTER — Non-Acute Institutional Stay (SKILLED_NURSING_FACILITY): Payer: Medicare Other | Admitting: Internal Medicine

## 2014-09-28 ENCOUNTER — Other Ambulatory Visit: Payer: Self-pay | Admitting: *Deleted

## 2014-09-28 DIAGNOSIS — R300 Dysuria: Secondary | ICD-10-CM

## 2014-09-28 DIAGNOSIS — I10 Essential (primary) hypertension: Secondary | ICD-10-CM

## 2014-09-28 DIAGNOSIS — D62 Acute posthemorrhagic anemia: Secondary | ICD-10-CM

## 2014-09-28 DIAGNOSIS — C679 Malignant neoplasm of bladder, unspecified: Secondary | ICD-10-CM

## 2014-09-28 DIAGNOSIS — R531 Weakness: Secondary | ICD-10-CM

## 2014-09-28 DIAGNOSIS — K219 Gastro-esophageal reflux disease without esophagitis: Secondary | ICD-10-CM

## 2014-09-28 DIAGNOSIS — J439 Emphysema, unspecified: Secondary | ICD-10-CM

## 2014-09-28 MED ORDER — HYDROCODONE-ACETAMINOPHEN 5-325 MG PO TABS: 1.0000 | ORAL_TABLET | Freq: Four times a day (QID) | ORAL | Status: DC | PRN

## 2014-09-28 NOTE — Telephone Encounter (Signed)
[facility] 

## 2014-10-01 ENCOUNTER — Encounter: Payer: Self-pay | Admitting: Internal Medicine

## 2014-10-01 DIAGNOSIS — I10 Essential (primary) hypertension: Secondary | ICD-10-CM | POA: Insufficient documentation

## 2014-10-01 NOTE — Progress Notes (Signed)
Patient ID: Julian Hale, male   DOB: 1926-06-23, 80 y.o.   MRN: [id number]     Facility: [facility]    PCP: Wyatt Haste, MD  Code Status: dnr  No Known Allergies  Chief Complaint: new admit  HPI:  80 y/o male patient is here for STR after hospital admission from 09/24/14- 09/27/14 with hematuria and suprapubic pain.He has hx of muscle invasive bladder cancer. He was started on antibiotics and urology was consulted. The pain was thought to be secondary to muscle invasion by the bladder cancer and thus antibiotics were discontinued. He underwent cystoscopy with TURBT and had right ureteral stent placement. Post stent placement, his hematuria resolved and he was able to urinate. Due to his deconditioning, SNF was recommended by therapy team Patient is seen in his room with his daughter present at bedside. Patient complaints of pain with urination for few days. He mentions having dysuria in hospital which had resolved and now has it again. Denies hematuria or flank pain. His appetite has decreased over last 2 months , no recent change/ worsening. Denies nausea or vomiting. Feels tired. He has hx of PVD, CAD, COPD  Review of Systems:  Constitutional: Negative for fever, chills, diaphoresis.  HENT: Negative for congestion.   Eyes: Negative for eye pain, blurred vision, double vision and discharge.  Respiratory: Negative for shortness of breath at rest. Has copd and has chronic cough with white phlegm Cardiovascular: Negative for chest pain, palpitations, orthopnea and leg swelling.  Gastrointestinal: Negative for heartburn, nausea, vomiting, abdominal pain.  Genitourinary: Negative for hematuria and flank pain.  Musculoskeletal: Negative for back pain, falls Skin: Negative for itching and rash.  Neurological: Negative for dizziness, tingling, focal weakness and headaches.  Psychiatric/Behavioral: Negative for depression.     Past Medical History    Diagnosis Date  . ASHD (arteriosclerotic heart disease)   . COPD (chronic obstructive pulmonary disease)   . GERD (gastroesophageal reflux disease)   . BPH (benign prostatic hyperplasia)   . Stroke 01/1999    "mild" (11/16/2012)  . Hypertension   . Hypercholesteremia   . Exertional dyspnea   . S/P angioplasty with stent, to high grade Lt external iliac atery  11/16/12 11/17/2012  . Hematoma of groin, at cath site 11/17/2012  . Acute blood loss anemia, lt groin hematoma 11/17/2012  . CAD (coronary artery disease)   . S/P CABG (coronary artery bypass graft) 2006  . PAD (peripheral artery disease)    Past Surgical History  Procedure Laterality Date  . Cholecystectomy open    . Iliac artery stent  11/16/2012    95% focal stenosis in L external iliac artery, stented w/ a 11mmx3cm Cordis Smart Nitinol self-expanding stent, resulting in reduction of a 95% stenosis to 0% residual.  . Colectomy  ~ 2006    "partial" (11/16/2012)  . Lea doppler  12/17/2012    Normal  . Cardiovascular stress test  10/15/2012    Lexiscan. Basal to Mid Inferolateral bowel artifact. No Lexiscan EKG changes, non-diagnostic for ischemia. No reversible ischemia.  . Transthoracic echocardiogram  04/12/2013    EF 55-60%, LA moderately dilated,   . Femoral-femoral bypass graft    . Eye surgery      bilateral cataracts  . Hernia repair      left  . Transurethral resection of bladder tumor with gyrus (turbt-gyrus) N/A 11/04/2013    Procedure: TRANSURETHRAL RESECTION OF BLADDER TUMOR WITH GYRUS (TURBT-GYRUS);  Surgeon: Claybon Jabs, MD;  Location: [facility];  Service: Urology;  Laterality: N/A;  . Transurethral resection of bladder tumor N/A 09/24/2014    Procedure: TRANSURETHRAL RESECTION OF BLADDER TUMOR (TURBT) GREATER THAN 5 CM;  Surgeon: Festus Aloe, MD;  Location: [facility];  Service: Urology;  Laterality: N/A;  . Cystoscopy Right 09/24/2014    Procedure: CYSTOSCOPY WITH CLOT EVACUATION, RIGHT RETROGRADE, RIGHT  URETERAL STENT PLACEMENT;  Surgeon: Festus Aloe, MD;  Location: [facility];  Service: Urology;  Laterality: Right;   Social History:   reports that he quit smoking about 30 years ago. His smoking use included Cigarettes. He has a 60 pack-year smoking history. He has never used smokeless tobacco. He reports that he drinks about 3 ounces of alcohol per week. He reports that he does not use illicit drugs.  History reviewed. No pertinent family history.  Medications: Patient's Medications  New Prescriptions   No medications on file  Previous Medications   ALBUTEROL (PROVENTIL) (2.5 MG/3ML) 0.083% NEBULIZER SOLUTION    Take 3 mLs (2.5 mg total) by nebulization every 4 (four) hours as needed for wheezing or shortness of breath.   FEEDING SUPPLEMENT, ENSURE COMPLETE, (ENSURE COMPLETE) LIQD    Take 237 mLs by mouth 3 (three) times daily between meals.   FERROUS SULFATE 325 (65 FE) MG EC TABLET    Take 1 tablet (325 mg total) by mouth daily with breakfast.   FINASTERIDE (PROSCAR) 5 MG TABLET    Take 1 tablet (5 mg total) by mouth daily.   HYDROCODONE-ACETAMINOPHEN (NORCO) 5-325 MG PER TABLET    Take 1 tablet by mouth every 6 (six) hours as needed for moderate pain.   ISOSORBIDE MONONITRATE (IMDUR) 30 MG 24 HR TABLET    Take 1 tablet (30 mg total) by mouth every morning.   METOPROLOL TARTRATE (LOPRESSOR) 25 MG TABLET    Take 1 tablet (25 mg total) by mouth 2 (two) times daily.   NEOMYCIN-BACITRACIN-POLYMYXIN (NEOSPORIN) OINTMENT    Apply 1 application topically 3 (three) times daily as needed for wound care (catheter irritation). apply to urethral meatus only   OMEPRAZOLE (PRILOSEC) 20 MG CAPSULE    Take 20 mg by mouth daily.   SIMVASTATIN (ZOCOR) 80 MG TABLET    Take 40 mg by mouth daily.   SOLIFENACIN (VESICARE) 10 MG TABLET    Take 10 mg by mouth daily.   TERAZOSIN (HYTRIN) 1 MG CAPSULE    Take 1 mg by mouth 2 (two) times daily.  Modified Medications   No medications on file  Discontinued  Medications   No medications on file     Physical Exam:  Filed Vitals:   09/28/14 1130  BP: 110/64  Pulse: 86  Temp: 98.7 F (37.1 C)  Resp: 19    General- elderly male in no acute distress Head- atraumatic, normocephalic Eyes- PERRLA, EOMI, no pallor, no icterus, no discharge Neck- no lymphadenopathy Throat- moist mucus membrane Cardiovascular- normal s1,s2, no murmurs Respiratory- bilateral clear to auscultation, has expiratory wheeze, no rhonchi, no crackles, no use of accessory muscles Abdomen- bowel sounds present, soft, non tender Musculoskeletal- able to move all 4 extremities, muscle wasting, generalized weakness, normal range of motion, no leg edema Neurological- no focal deficit Skin- warm and dry Psychiatry- alert and oriented to person, place and time, normal mood and affect   Labs reviewed: Basic Metabolic Panel:  Recent Labs  09/25/14 0435 09/26/14 0420 09/27/14 0430  NA 139 139 136*  K 3.8 4.4 4.3  CL 107 105 100  CO2 20  24 26  GLUCOSE 130* 124* 96  BUN 12 14 14   CREATININE 1.11 1.20 1.07  CALCIUM 8.2* 8.5 8.7   Liver Function Tests:  Recent Labs  10/17/13 1922 09/09/14 1020  AST 27 12  ALT 14 6  ALKPHOS 104 109  BILITOT 0.5 0.4  PROT 6.5 6.8  ALBUMIN 3.5 3.2*   No results found for this basename: LIPASE, AMYLASE,  in the last 8760 hours No results found for this basename: AMMONIA,  in the last 8760 hours CBC:  Recent Labs  10/17/13 1922  09/09/14 1020 09/24/14 0411  09/25/14 0435 09/26/14 0420 09/27/14 0430  WBC 6.1  < > 7.0 7.8  --   --  9.7 10.5  NEUTROABS 3.2  --  4.8 5.2  --   --   --   --   HGB 13.0  < > 10.6* 9.0*  < > 8.4* 8.2* 8.8*  HCT 39.3  < > 33.9* 27.6*  < > 26.4* 26.7* 27.7*  MCV 94.2  < > 90.4 88.2  --   --  91.4 91.4  PLT 129*  < > 170 195  --   --  181 183  < > = values in this interval not displayed.   Radiological Exams:  Ct Abdomen Pelvis Wo Contrast  09/24/2014   CLINICAL DATA:  Hematuria and pain  with urination. Known bladder cancer, not treated.  EXAM: CT ABDOMEN AND PELVIS WITHOUT CONTRAST  TECHNIQUE: Multidetector CT imaging of the abdomen and pelvis was performed following the standard protocol without IV contrast.  COMPARISON:  09/09/2014  FINDINGS: The lung bases are clear.  Moderate-sized esophageal hiatal hernia.  Surgical absence of the gallbladder. Unenhanced appearance of the liver, spleen, pancreas, adrenal glands, inferior vena cava, and retroperitoneal lymph nodes is unremarkable. Calcification of the abdominal aorta without aneurysm. Kidneys demonstrate multiple masses shown to represent cysts on prior studies. No hydronephrosis in either kidney. Stomach, small bowel, and colon are not abnormally distended. No free air or free fluid in the abdomen.  The bladder is incompletely distended. Again, there is asymmetric bladder wall thickening with a large mass in the bladder. Increased density suggest hemorrhage. Some of this may represent blood clots adjacent to the mass. Note gas in the bladder. There is bladder wall thickening antral laterally towards the right with infiltration in the surrounding fat, likely indicating direct invasion of the surrounding fat. No significant lymphadenopathy in the pelvis. Appendix is normal. Surgical clips in the pelvis. Vascular calcifications and stent in the left external iliac artery. Femoral femoral bypass graft. Left inguinal hernia containing fat. Old fracture deformity of the right inferior pubic ramus. Compression fractures of L3 and L4, unchanged since previous study. No destructive bone lesions.  IMPRESSION: The most significant finding is a large mass and blood clot in the bladder with asymmetric bladder wall thickening to the right anteriorly. Probable direct invasion in the pelvic fat around the bladder. No significant lymphadenopathy. Multiple incidental findings as discussed.   Electronically Signed   By: Lucienne Capers M.D.   On: 09/24/2014  06:18   Ct Abdomen Pelvis W Contrast  09/09/2014   CLINICAL DATA:  Hematuria.  Dysuria.  History of bladder cancer.  EXAM: CT ABDOMEN AND PELVIS WITH CONTRAST  TECHNIQUE: Multidetector CT imaging of the abdomen and pelvis was performed using the standard protocol following bolus administration of intravenous contrast.  CONTRAST:  77mL OMNIPAQUE IOHEXOL 300 MG/ML SOLN, 169mL OMNIPAQUE IOHEXOL 300 MG/ML SOLN  COMPARISON:  10/21/2013  FINDINGS: Lower chest: Moderate-sized hiatal hernia with 1.0 cm in short axis lymph node or small fluid collection adjacent to the hiatal hernia on the right side.  Hepatobiliary: Prior cholecystectomy.  Otherwise negative.  Spleen: Intact  Pancreas: Intact  Stomach/Bowel: Scattered colonic diverticula. No dilated bowel. Terminal ileum unremarkable. Appendix absent.  Adrenals/urinary tract: There is an 8.2 x 4.1 cm high density filling defect to the right side of the urinary bladder compatible with tumor or blood clot. This has heterogeneous internal density. Tumor is favored. The hematuria protocol was not utilized. No compelling evidence of other enhancement along the urothelium. Bilateral renal cysts are present; a variety of the tiny renal hypodense lesions are technically too small to characterize.  Vascular/Lymphatic: Aortoiliac atherosclerotic vascular disease. 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 A fem-fem bypass is patent. I do not see definite pathologic pelvic adenopathy.  Reproductive: Prostate gland and seminal vesicles not well seen.  Musculoskeletal: Old right pubic ramus fractures, healed. I do not observe definite osseous metastatic disease. Bony demineralization. Superior endplate compression fractures at L3 and L4 are similar to prior. Left inguinal hernia contain small bowel and adipose tissue, without findings of strangulation or obstruction.  Other: None  IMPRESSION: 1. Enlargement of the right bladder  tumor, currently 8.2 x 4.1 cm. Some of this density could represent blood clot adjacent to the known tumor. 2. Moderate-sized hiatal hernia. 3. Remote superior endplate compression fractures at L3 and L4. 4. Left inguinal hernia contains adipose tissue and nondilated/non-strangulated small bowel.   Electronically Signed   By: Sherryl Barters M.D.   On: 09/09/2014 12:27   Dg Esophagus  09/06/2014   CLINICAL DATA:  Dysphagia  EXAM: ESOPHOGRAM/BARIUM SWALLOW  TECHNIQUE: Single contrast examination was performed using  thin barium.  FLUOROSCOPY TIME:  0 min 54 seconds  COMPARISON:  None.  FINDINGS: With the patient's history of coughing after swallowing, the study was begun on the lateral projection. There is some penetration of barium, but no aspiration is seen. There is a prominent cricopharyngeus muscle noted with a is small Zenker's diverticulum present. A small Zenker's diverticulum was described on the dictated report from 07/04/2003. Mild tertiary contractions are noted in the mid and distal esophagus. A small to moderate size hiatal hernia is present. Moderate gastroesophageal reflux is demonstrated. At the referring physician's request, no barium pill was given.  IMPRESSION: 1. Small to moderate size hiatal hernia with moderate gastroesophageal reflux. 2. Prominent cricopharyngeus muscle with small Zenker's diverticulum, which was described on the prior dictated report from barium swallow of 2004.   Electronically Signed   By: Ivar Drape M.D.   On: 09/06/2014 11:52   Assessment/Plan  Generalized weakness Will have him work with physical therapy and occupational therapy team to help with gait training and muscle strengthening exercises.fall precautions. Skin care. Encourage to be out of bed. Pressure ulcer prophylaxis. Continue medpass supplement  Dysuria New. Send u/a and c/s given recent cystoscopy is at increased risk for infection. Will add pyridium 100 mg q8h prn for dysuria. Encouraged  hydration  Anemia Likely post op due to blood loss. Monitor h&h. Continue iron supplement  Hiatal hernia with reflux Symptoms under control, continue omeprazole  Bladder cancer S/p stent placement. Has f/u with urology in 2 weeks for stent removal and follow up. Continue proscar, terazosin and vesicare, prn norco for pain  Copd With wheezing on exam, has chronic cough. D/c albuterol and have him on duoneb  q6h for 5 days and then q8h prn. Consider LABA for long term management  HTN Stable bp readings, continue current regimen of lopressor and imdur  Family/ staff Communication: reviewed care plan with patient and nursing supervisor  Goals of care:short term rehabilitation   Labs/tests ordered: cbc, cmp in 1 week    Blanchie Serve, MD  [place] [phone] (Monday-Friday 8 am - 5 pm) [phone] (afterhours)

## 2014-10-06 ENCOUNTER — Encounter: Payer: Self-pay | Admitting: Internal Medicine

## 2014-10-06 ENCOUNTER — Encounter: Payer: Self-pay | Admitting: Family Medicine

## 2014-10-09 ENCOUNTER — Non-Acute Institutional Stay (SKILLED_NURSING_FACILITY): Payer: Medicare Other | Admitting: Adult Health

## 2014-10-09 DIAGNOSIS — N400 Enlarged prostate without lower urinary tract symptoms: Secondary | ICD-10-CM

## 2014-10-09 DIAGNOSIS — I10 Essential (primary) hypertension: Secondary | ICD-10-CM

## 2014-10-09 DIAGNOSIS — C679 Malignant neoplasm of bladder, unspecified: Secondary | ICD-10-CM

## 2014-10-16 ENCOUNTER — Encounter (HOSPITAL_COMMUNITY): Payer: Self-pay | Admitting: Emergency Medicine

## 2014-10-16 ENCOUNTER — Ambulatory Visit: Payer: Medicare Other | Admitting: Family Medicine

## 2014-10-16 ENCOUNTER — Emergency Department (HOSPITAL_COMMUNITY): Payer: Medicare Other

## 2014-10-16 ENCOUNTER — Emergency Department (HOSPITAL_COMMUNITY)
Admission: EM | Admit: 2014-10-16 | Discharge: 2014-10-16 | Disposition: A | Discharge status: Home / Self Care | Payer: Medicare Other | Attending: Emergency Medicine | Admitting: Emergency Medicine

## 2014-10-16 DIAGNOSIS — Z9049 Acquired absence of other specified parts of digestive tract: Secondary | ICD-10-CM | POA: Insufficient documentation

## 2014-10-16 DIAGNOSIS — J449 Chronic obstructive pulmonary disease, unspecified: Secondary | ICD-10-CM | POA: Insufficient documentation

## 2014-10-16 DIAGNOSIS — Z87891 Personal history of nicotine dependence: Secondary | ICD-10-CM | POA: Insufficient documentation

## 2014-10-16 DIAGNOSIS — Z792 Long term (current) use of antibiotics: Secondary | ICD-10-CM | POA: Insufficient documentation

## 2014-10-16 DIAGNOSIS — R1032 Left lower quadrant pain: Secondary | ICD-10-CM | POA: Diagnosis present

## 2014-10-16 DIAGNOSIS — Z951 Presence of aortocoronary bypass graft: Secondary | ICD-10-CM | POA: Diagnosis not present

## 2014-10-16 DIAGNOSIS — Z8673 Personal history of transient ischemic attack (TIA), and cerebral infarction without residual deficits: Secondary | ICD-10-CM | POA: Insufficient documentation

## 2014-10-16 DIAGNOSIS — Z862 Personal history of diseases of the blood and blood-forming organs and certain disorders involving the immune mechanism: Secondary | ICD-10-CM | POA: Diagnosis not present

## 2014-10-16 DIAGNOSIS — Z79899 Other long term (current) drug therapy: Secondary | ICD-10-CM | POA: Insufficient documentation

## 2014-10-16 DIAGNOSIS — E780 Pure hypercholesterolemia: Secondary | ICD-10-CM | POA: Diagnosis not present

## 2014-10-16 DIAGNOSIS — I10 Essential (primary) hypertension: Secondary | ICD-10-CM | POA: Diagnosis not present

## 2014-10-16 DIAGNOSIS — K219 Gastro-esophageal reflux disease without esophagitis: Secondary | ICD-10-CM | POA: Diagnosis not present

## 2014-10-16 DIAGNOSIS — Z9861 Coronary angioplasty status: Secondary | ICD-10-CM | POA: Diagnosis not present

## 2014-10-16 DIAGNOSIS — N400 Enlarged prostate without lower urinary tract symptoms: Secondary | ICD-10-CM | POA: Diagnosis not present

## 2014-10-16 DIAGNOSIS — I2510 Atherosclerotic heart disease of native coronary artery without angina pectoris: Secondary | ICD-10-CM | POA: Insufficient documentation

## 2014-10-16 DIAGNOSIS — N390 Urinary tract infection, site not specified: Secondary | ICD-10-CM | POA: Insufficient documentation

## 2014-10-16 LAB — CBC WITH DIFFERENTIAL/PLATELET
Basophils Absolute: 0.1 10*3/uL (ref 0.0–0.1)
Basophils Relative: 1 % (ref 0–1)
Eosinophils Absolute: 0.4 10*3/uL (ref 0.0–0.7)
Eosinophils Relative: 4 % (ref 0–5)
HCT: 31.2 % — ABNORMAL LOW (ref 39.0–52.0)
Hemoglobin: 9.9 g/dL — ABNORMAL LOW (ref 13.0–17.0)
Lymphocytes Relative: 20 % (ref 12–46)
Lymphs Abs: 1.7 10*3/uL (ref 0.7–4.0)
MCH: 28.5 pg (ref 26.0–34.0)
MCHC: 31.7 g/dL (ref 30.0–36.0)
MCV: 89.9 fL (ref 78.0–100.0)
Monocytes Absolute: 0.8 10*3/uL (ref 0.1–1.0)
Monocytes Relative: 9 % (ref 3–12)
Neutro Abs: 5.7 10*3/uL (ref 1.7–7.7)
Neutrophils Relative %: 66 % (ref 43–77)
Platelets: 240 10*3/uL (ref 150–400)
RBC: 3.47 MIL/uL — ABNORMAL LOW (ref 4.22–5.81)
RDW: 14.2 % (ref 11.5–15.5)
WBC: 8.6 10*3/uL (ref 4.0–10.5)

## 2014-10-16 LAB — URINALYSIS, ROUTINE W REFLEX MICROSCOPIC
Bilirubin Urine: NEGATIVE
Glucose, UA: NEGATIVE mg/dL
Ketones, ur: NEGATIVE mg/dL
Nitrite: NEGATIVE
Protein, ur: 100 mg/dL — AB
Specific Gravity, Urine: 1.022 (ref 1.005–1.030)
Urobilinogen, UA: 1 mg/dL (ref 0.0–1.0)
pH: 6 (ref 5.0–8.0)

## 2014-10-16 LAB — COMPREHENSIVE METABOLIC PANEL
ALT: 9 U/L (ref 0–53)
AST: 14 U/L (ref 0–37)
Albumin: 2.9 g/dL — ABNORMAL LOW (ref 3.5–5.2)
Alkaline Phosphatase: 110 U/L (ref 39–117)
Anion gap: 13 (ref 5–15)
BUN: 22 mg/dL (ref 6–23)
CO2: 22 mEq/L (ref 19–32)
Calcium: 9 mg/dL (ref 8.4–10.5)
Chloride: 101 mEq/L (ref 96–112)
Creatinine, Ser: 1.13 mg/dL (ref 0.50–1.35)
GFR calc Af Amer: 66 mL/min — ABNORMAL LOW (ref >90)
GFR calc non Af Amer: 57 mL/min — ABNORMAL LOW (ref >90)
Glucose, Bld: 99 mg/dL (ref 70–99)
Potassium: 4.4 mEq/L (ref 3.7–5.3)
Sodium: 136 mEq/L — ABNORMAL LOW (ref 137–147)
Total Bilirubin: 0.4 mg/dL (ref 0.3–1.2)
Total Protein: 6.7 g/dL (ref 6.0–8.3)

## 2014-10-16 LAB — LIPASE, BLOOD: Lipase: 39 U/L (ref 11–59)

## 2014-10-16 LAB — URINE MICROSCOPIC-ADD ON

## 2014-10-16 MED ORDER — ONDANSETRON HCL 4 MG/2ML IJ SOLN
4.0000 mg | Freq: Once | INTRAMUSCULAR | Status: AC
Start: 2014-10-16 — End: 2014-10-16
  Administered 2014-10-16: 4 mg via INTRAVENOUS
  Filled 2014-10-16: qty 2

## 2014-10-16 MED ORDER — MORPHINE SULFATE 4 MG/ML IJ SOLN
6.0000 mg | Freq: Once | INTRAMUSCULAR | Status: AC
  Administered 2014-10-16: 6 mg via INTRAVENOUS
  Filled 2014-10-16: qty 2

## 2014-10-16 MED ORDER — CEPHALEXIN 500 MG PO CAPS: 500.0000 mg | ORAL_CAPSULE | Freq: Four times a day (QID) | ORAL | Status: DC

## 2014-10-16 MED ORDER — HYDROCODONE-ACETAMINOPHEN 5-325 MG PO TABS: 1.0000 | ORAL_TABLET | ORAL | Status: DC | PRN

## 2014-10-16 MED ORDER — IOHEXOL 300 MG/ML  SOLN
50.0000 mL | Freq: Once | INTRAMUSCULAR | Status: AC | PRN
  Administered 2014-10-16: 50 mL via ORAL

## 2014-10-16 MED ORDER — DEXTROSE 5 % IV SOLN: 1.0000 g | Freq: Once | INTRAVENOUS | Status: DC

## 2014-10-16 MED ORDER — SODIUM CHLORIDE 0.9 % IV BOLUS (SEPSIS)
500.0000 mL | Freq: Once | INTRAVENOUS | Status: AC
  Administered 2014-10-16: 500 mL via INTRAVENOUS

## 2014-10-16 MED ORDER — MORPHINE SULFATE 4 MG/ML IJ SOLN
4.0000 mg | Freq: Once | INTRAMUSCULAR | Status: AC
  Administered 2014-10-16: 4 mg via INTRAVENOUS
  Filled 2014-10-16: qty 1

## 2014-10-16 MED ORDER — IOHEXOL 300 MG/ML  SOLN
100.0000 mL | Freq: Once | INTRAMUSCULAR | Status: AC | PRN
  Administered 2014-10-16: 100 mL via INTRAVENOUS

## 2014-10-16 NOTE — ED Notes (Signed)
Awake. Verbally responsive. Resp even and unlabored. ABC intact.

## 2014-10-16 NOTE — ED Notes (Signed)
Patient transported to CT 

## 2014-10-16 NOTE — ED Notes (Signed)
MD at bedside. 

## 2014-10-16 NOTE — ED Notes (Signed)
Pt states that he thinks he has a bowel blockage.  C/o low abd pain, no NVD.  States last BM was 4 days ago.

## 2014-10-16 NOTE — ED Provider Notes (Signed)
CSN: [id number]     Arrival date & time 10/16/14  1603 History   First MD Initiated Contact with Patient 10/16/14 1754     Chief Complaint  Patient presents with  . Abdominal Pain  . Constipation     (Consider location/radiation/quality/duration/timing/severity/associated sxs/prior Treatment) HPI   88yM with lower abdominal pain. Unable to give me exact onset, but worse over last 2-3 days. Suprapubic and LLQ. Constant. Worse with movement. Better when still. Known bladder CA. Intermittent hematuria, none currently. No dysuria. No fever or chills. No n/v/d. Thinks constipated. Last BM 4 days ago.   Past Medical History  Diagnosis Date  . ASHD (arteriosclerotic heart disease)   . COPD (chronic obstructive pulmonary disease)   . GERD (gastroesophageal reflux disease)   . BPH (benign prostatic hyperplasia)   . Stroke 01/1999    "mild" (11/16/2012)  . Hypertension   . Hypercholesteremia   . Exertional dyspnea   . S/P angioplasty with stent, to high grade Lt external iliac atery  11/16/12 11/17/2012  . Hematoma of groin, at cath site 11/17/2012  . Acute blood loss anemia, lt groin hematoma 11/17/2012  . CAD (coronary artery disease)   . S/P CABG (coronary artery bypass graft) 2006  . PAD (peripheral artery disease)    Past Surgical History  Procedure Laterality Date  . Cholecystectomy open    . Iliac artery stent  11/16/2012    95% focal stenosis in L external iliac artery, stented w/ a 45mmx3cm Cordis Smart Nitinol self-expanding stent, resulting in reduction of a 95% stenosis to 0% residual.  . Colectomy  ~ 2006    "partial" (11/16/2012)  . Lea doppler  12/17/2012    Normal  . Cardiovascular stress test  10/15/2012    Lexiscan. Basal to Mid Inferolateral bowel artifact. No Lexiscan EKG changes, non-diagnostic for ischemia. No reversible ischemia.  . Transthoracic echocardiogram  04/12/2013    EF 55-60%, LA moderately dilated,   . Femoral-femoral bypass graft    . Eye surgery       bilateral cataracts  . Hernia repair      left  . Transurethral resection of bladder tumor with gyrus (turbt-gyrus) N/A 11/04/2013    Procedure: TRANSURETHRAL RESECTION OF BLADDER TUMOR WITH GYRUS (TURBT-GYRUS);  Surgeon: Claybon Jabs, MD;  Location: [facility];  Service: Urology;  Laterality: N/A;  . Transurethral resection of bladder tumor N/A 09/24/2014    Procedure: TRANSURETHRAL RESECTION OF BLADDER TUMOR (TURBT) GREATER THAN 5 CM;  Surgeon: Festus Aloe, MD;  Location: [facility];  Service: Urology;  Laterality: N/A;  . Cystoscopy Right 09/24/2014    Procedure: CYSTOSCOPY WITH CLOT EVACUATION, RIGHT RETROGRADE, RIGHT URETERAL STENT PLACEMENT;  Surgeon: Festus Aloe, MD;  Location: [facility];  Service: Urology;  Laterality: Right;  . Upper gi endoscopy  09/08/2014    Esophageal stricture   No family history on file. History  Substance Use Topics  . Smoking status: Former Smoker -- 2.00 packs/day for 30 years    Types: Cigarettes    Quit date: 07/31/1984  . Smokeless tobacco: Never Used     Comment: 11/16/2012 "quit smoking cigarettes in the 1970's"  . Alcohol Use: 3.0 oz/week    5 Glasses of wine per week     Comment: 11/16/2012 "wine cooler 4-5 nights/wk"    Review of Systems  All systems reviewed and negative, other than as noted in HPI.   Allergies  Review of patient's allergies indicates no known allergies.  Home Medications   Prior  to Admission medications   Medication Sig Start Date End Date Taking? Authorizing Provider  feeding supplement, ENSURE COMPLETE, (ENSURE COMPLETE) LIQD Take 237 mLs by mouth 3 (three) times daily between meals. 09/27/14  Yes Janece Canterbury, MD  finasteride (PROSCAR) 5 MG tablet Take 1 tablet (5 mg total) by mouth daily. 09/27/14  Yes Janece Canterbury, MD  isosorbide mononitrate (IMDUR) 30 MG 24 hr tablet Take 1 tablet (30 mg total) by mouth every morning. 02/06/14  Yes Denita Lung, MD  metoprolol tartrate (LOPRESSOR) 25 MG tablet Take 1  tablet (25 mg total) by mouth 2 (two) times daily. 09/27/14  Yes Janece Canterbury, MD  neomycin-bacitracin-polymyxin (NEOSPORIN) ointment Apply 1 application topically 3 (three) times daily as needed for wound care (catheter irritation). apply to urethral meatus only 09/27/14  Yes Janece Canterbury, MD  omeprazole (PRILOSEC) 20 MG capsule Take 20 mg by mouth daily.   Yes Historical Provider, MD  simvastatin (ZOCOR) 80 MG tablet Take 40 mg by mouth daily.   Yes Historical Provider, MD  solifenacin (VESICARE) 10 MG tablet Take 10 mg by mouth daily.   Yes Historical Provider, MD  terazosin (HYTRIN) 1 MG capsule Take 1 mg by mouth 2 (two) times daily.   Yes Historical Provider, MD   BP 120/52  Pulse 68  Temp(Src) 97.4 F (36.3 C) (Oral)  Resp 20  SpO2 100% Physical Exam  Constitutional: He appears well-developed and well-nourished. No distress.  HENT:  Head: Normocephalic and atraumatic.  Eyes: Conjunctivae are normal. Right eye exhibits no discharge. Left eye exhibits no discharge.  Neck: Neck supple.  Cardiovascular: Normal rate, regular rhythm and normal heart sounds.  Exam reveals no gallop and no friction rub.   No murmur heard. Pulmonary/Chest: Effort normal and breath sounds normal. No respiratory distress.  Abdominal: Soft. He exhibits no distension. There is tenderness. There is no rebound and no guarding.  Suprapubic and LLQ tenderness. No rebound/guarding.   Genitourinary:  No cva tenderness  Musculoskeletal: He exhibits no edema or tenderness.  Neurological: He is alert.  Skin: Skin is warm and dry.  Psychiatric: He has a normal mood and affect. His behavior is normal. Thought content normal.  Nursing note and vitals reviewed.   ED Course  Procedures (including critical care time) Labs Review Labs Reviewed  CBC WITH DIFFERENTIAL - Abnormal; Notable for the following:    RBC 3.47 (*)    Hemoglobin 9.9 (*)    HCT 31.2 (*)    All other components within normal limits   COMPREHENSIVE METABOLIC PANEL - Abnormal; Notable for the following:    Sodium 136 (*)    Albumin 2.9 (*)    GFR calc non Af Amer 57 (*)    GFR calc Af Amer 66 (*)    All other components within normal limits  URINALYSIS, ROUTINE W REFLEX MICROSCOPIC - Abnormal; Notable for the following:    APPearance CLOUDY (*)    Hgb urine dipstick LARGE (*)    Protein, ur 100 (*)    Leukocytes, UA LARGE (*)    All other components within normal limits  URINE MICROSCOPIC-ADD ON - Abnormal; Notable for the following:    Squamous Epithelial / LPF FEW (*)    Bacteria, UA MANY (*)    All other components within normal limits  LIPASE, BLOOD    Imaging Review No results found.   EKG Interpretation None      MDM   Final diagnoses:  LLQ pain    88yM  with LLQ pain. Suspect related to bladder/prostate mass. Known malignancy under urology care. Symptoms currently controlled. Afebrile. Nontoxic appearance. COntinued symptom control. Abx for possible UTI. WIll send culture.     Virgel Manifold, MD 10/26/14 [phone]

## 2014-10-16 NOTE — ED Notes (Signed)
Patient with hx of bladder cancer which has advanced due to patient not seeking tx Patient denies hx of SBO This nurse asked why he believed that he had an obstruction and patient stated that he did not know

## 2014-10-16 NOTE — ED Notes (Signed)
Attempted PIV access x 2 without success Will have another ED RN attempt placement

## 2014-10-16 NOTE — Discharge Instructions (Signed)

## 2014-10-16 NOTE — ED Notes (Signed)
Family at bedside. 

## 2014-10-17 NOTE — Progress Notes (Signed)
Patient ID: Julian Hale, male   DOB: 1926/05/13, 80 y.o.   MRN: [id number]     Julian [facility] place  No Known Allergies  Chief Complaint  Patient presents with  . Discharge Note    HPI:  He is preparing to be discharged to home with home health for pt/otrn/aid. He will not need dme at this time. He will need his prescriptions to be written. And will need to be followed up with his pcp upon discharged to home.   Past Medical History  Diagnosis Date  . ASHD (arteriosclerotic heart disease)   . COPD (chronic obstructive pulmonary disease)   . GERD (gastroesophageal reflux disease)   . BPH (benign prostatic hyperplasia)   . Stroke 01/1999    "mild" (11/16/2012)  . Hypertension   . Hypercholesteremia   . Exertional dyspnea   . S/P angioplasty with stent, to high grade Lt external iliac atery  11/16/12 11/17/2012  . Hematoma of groin, at cath site 11/17/2012  . Acute blood loss anemia, lt groin hematoma 11/17/2012  . CAD (coronary artery disease)   . S/P CABG (coronary artery bypass graft) 2006  . PAD (peripheral artery disease)     Past Surgical History  Procedure Laterality Date  . Cholecystectomy open    . Iliac artery stent  11/16/2012    95% focal stenosis in L external iliac artery, stented w/ a 37mmx3cm Cordis Smart Nitinol self-expanding stent, resulting in reduction of a 95% stenosis to 0% residual.  . Colectomy  ~ 2006    "partial" (11/16/2012)  . Lea doppler  12/17/2012    Normal  . Cardiovascular stress test  10/15/2012    Lexiscan. Basal to Mid Inferolateral bowel artifact. No Lexiscan EKG changes, non-diagnostic for ischemia. No reversible ischemia.  . Transthoracic echocardiogram  04/12/2013    EF 55-60%, LA moderately dilated,   . Femoral-femoral bypass graft    . Eye surgery      bilateral cataracts  . Hernia repair      left  . Transurethral resection of bladder tumor with gyrus (turbt-gyrus) N/A 11/04/2013    Procedure: TRANSURETHRAL RESECTION OF BLADDER  TUMOR WITH GYRUS (TURBT-GYRUS);  Surgeon: Claybon Jabs, MD;  Location: [facility];  Service: Urology;  Laterality: N/A;  . Transurethral resection of bladder tumor N/A 09/24/2014    Procedure: TRANSURETHRAL RESECTION OF BLADDER TUMOR (TURBT) GREATER THAN 5 CM;  Surgeon: Festus Aloe, MD;  Location: [facility];  Service: Urology;  Laterality: N/A;  . Cystoscopy Right 09/24/2014    Procedure: CYSTOSCOPY WITH CLOT EVACUATION, RIGHT RETROGRADE, RIGHT URETERAL STENT PLACEMENT;  Surgeon: Festus Aloe, MD;  Location: [facility];  Service: Urology;  Laterality: Right;  . Upper gi endoscopy  09/08/2014    Esophageal stricture    VITAL SIGNS BP 114/63  Pulse 80  Ht 5\' 10"  (1.778 m)  Wt 145 lb 9.6 oz (66.044 kg)  BMI 20.89 kg/m2   Patient's Medications  New Prescriptions   No medications on file  Previous Medications   CEPHALEXIN (KEFLEX) 500 MG CAPSULE    Take 1 capsule (500 mg total) by mouth 4 (four) times daily.   FEEDING SUPPLEMENT, ENSURE COMPLETE, (ENSURE COMPLETE) LIQD    Take 237 mLs by mouth 3 (three) times daily between meals.   FINASTERIDE (PROSCAR) 5 MG TABLET    Take 1 tablet (5 mg total) by mouth daily.   HYDROCODONE-ACETAMINOPHEN (NORCO/VICODIN) 5-325 MG PER TABLET    Take 1-2 tablets by mouth every 4 (four) hours as needed  for moderate pain or severe pain.   ISOSORBIDE MONONITRATE (IMDUR) 30 MG 24 HR TABLET    Take 1 tablet (30 mg total) by mouth every morning.   METOPROLOL TARTRATE (LOPRESSOR) 25 MG TABLET    Take 1 tablet (25 mg total) by mouth 2 (two) times daily.   NEOMYCIN-BACITRACIN-POLYMYXIN (NEOSPORIN) OINTMENT    Apply 1 application topically 3 (three) times daily as needed for wound care (catheter irritation). apply to urethral meatus only   OMEPRAZOLE (PRILOSEC) 20 MG CAPSULE    Take 20 mg by mouth daily.   SIMVASTATIN (ZOCOR) 80 MG TABLET    Take 40 mg by mouth daily.   SOLIFENACIN (VESICARE) 10 MG TABLET    Take 10 mg by mouth daily.   TERAZOSIN (HYTRIN) 1 MG CAPSULE     Take 1 mg by mouth 2 (two) times daily.  Modified Medications   No medications on file  Discontinued Medications   No medications on file    Review of Systems:  Constitutional: Negative for fever, chills, diaphoresis.  HENT: Negative for congestion.   Eyes: Negative for eye pain, blurred vision, double vision and discharge.  Respiratory: Negative for shortness of breath at rest. Has copd and has chronic cough with white phlegm Cardiovascular: Negative for chest pain, palpitations, orthopnea and leg swelling.  Gastrointestinal: Negative for heartburn, nausea, vomiting, abdominal pain.  Genitourinary: Negative for hematuria and flank pain.  Musculoskeletal: Negative for back pain, falls Skin: Negative for itching and rash.  Neurological: Negative for dizziness, tingling, focal weakness and headaches.  Psychiatric/Behavioral: Negative for depression.      General- elderly male in no acute distress Head- atraumatic, normocephalic Eyes- PERRLA, EOMI, no pallor, no icterus, no discharge Neck- no lymphadenopathy Throat- moist mucus membrane Cardiovascular- normal s1,s2, no murmurs Respiratory- bilateral clear to auscultation, has expiratory wheeze, no rhonchi, no crackles, no use of accessory muscles Abdomen- bowel sounds present, soft, non tender Musculoskeletal- able to move all 4 extremities, muscle wasting, generalized weakness, normal range of motion, no leg edema Neurological- no focal deficit Skin- warm and dry Psychiatry- alert and oriented to person, place and time, normal mood and affect   Labs reviewed: Basic Metabolic Panel:  Recent Labs  09/25/14 0435 09/26/14 0420 09/27/14 0430  NA 139 139 136*  K 3.8 4.4 4.3  CL 107 105 100  CO2 20 24 26   GLUCOSE 130* 124* 96  BUN 12 14 14   CREATININE 1.11 1.20 1.07  CALCIUM 8.2* 8.5 8.7   Liver Function Tests:  Recent Labs  10/17/13 1922 09/09/14 1020  AST 27 12  ALT 14 6  ALKPHOS 104 109  BILITOT 0.5 0.4  PROT  6.5 6.8  ALBUMIN 3.5 3.2*   No results found for this basename: LIPASE, AMYLASE,  in the last 8760 hours No results found for this basename: AMMONIA,  in the last 8760 hours CBC:  Recent Labs  10/17/13 1922  09/09/14 1020 09/24/14 0411  09/25/14 0435 09/26/14 0420 09/27/14 0430  WBC 6.1  < > 7.0 7.8  --   --  9.7 10.5  NEUTROABS 3.2  --  4.8 5.2  --   --   --   --   HGB 13.0  < > 10.6* 9.0*  < > 8.4* 8.2* 8.8*  HCT 39.3  < > 33.9* 27.6*  < > 26.4* 26.7* 27.7*  MCV 94.2  < > 90.4 88.2  --   --  91.4 91.4  PLT 129*  < > 170 195  --   --  181 183  < > = values in this interval not displayed.   Radiological Exams:  Ct Abdomen Pelvis Wo Contrast The most significant finding is a large mass and blood clot in the bladder with asymmetric bladder wall thickening to the right anteriorly. Probable direct invasion in the pelvic fat around the bladder. No significant lymphadenopathy. Multiple incidental findings as discussed.      09/09/2014   CT ABDOMEN AND PELVIS WITH CONTRAST   1. Enlargement of the right bladder tumor, currently 8.2 x 4.1 cm. Some of this density could represent blood clot adjacent to the known tumor. 2. Moderate-sized hiatal hernia. 3. Remote superior endplate compression fractures at L3 and L4. 4. Left inguinal hernia contains adipose tissue and nondilated/non-strangulated small bowel.     09/06/2014    ESOPHOGRAM/BARIUM SWALLOW   1. Small to moderate size hiatal hernia with moderate gastroesophageal reflux. 2. Prominent cricopharyngeus muscle with small Zenker's diverticulum, which was described on the prior dictated report from barium swallow of 2004.     ASSESSMENT/ PLAN:  Will discharge him to home with home health for pt/ot/rn/aid to improve upon gait strength; independence with adl's; medication and disease management; adl care. He does not need dme. His prescriptions have been written for a 30 day supply of his medications. He has a follow up with his pcp Julian Hale on 10-16-14 at 1045 am.   Time spent with patient 40 minutes    Ok Edwards NP [facility]  Contact [phone] Monday through Friday 8am- 5pm  After hours call [phone]

## 2014-10-18 LAB — URINE CULTURE: Colony Count: 25000

## 2014-10-20 ENCOUNTER — Telehealth: Payer: Self-pay | Admitting: Oncology

## 2014-10-20 ENCOUNTER — Encounter: Payer: Self-pay | Admitting: Radiation Oncology

## 2014-10-20 NOTE — Progress Notes (Addendum)
GU Location of Tumor / Histology: bladder, urothelial cancer  If Prostate Cancer, Gleason Score is ( + ) and PSA is ()  NA  Roma Schanz presented 12 months ago with signs/symptoms of: gross hematuria  Biopsies of  bladder revealed:  09/24/14 Diagnosis Bladder, transurethral resection - HIGH GRADE INVASIVE UROTHELIAL CARCINOMA. - MUSCULARIS PROPRIA TISSUE PRESENT AND INVOLVED BY TUMOR.  11/04/13 Diagnosis Bladder, transurethral resection - INVASIVE UROTHELIAL CARCINOMA. SEE COMMENT. - INVASIVE TUMOR EXTENSIVELY INVOLVES SMOOTH MUSCLE (MUSCULARIS PROPRIA). - INVASIVE TUMOR PRESENT AR CAUTERIZED TISSUE EDGES  Past/Anticipated interventions by urology, if any: CT scan, cystoscopy x 3, ureteral stent  Past/Anticipated interventions by medical oncology, if any: Dr Alen Blew, new patient consultation on 10/26/14  Weight changes, if any: loss of appetite, drinks Ensure 1-3 daily, lost 25 lbs in past 2-3 months  Bowel/Bladder complaints, if any:  Incontinence of bladder, wears depends, patient denies urinary bleeding, pain, dysuria; no bowel issues  Nausea/Vomiting, if any: no  Pain issues, if any: sacral pain with walking, sitting, pressure on this area, takes Hydrocodone prn    SAFETY ISSUES:  Prior radiation? no  Pacemaker/ICD? no  Possible current pregnancy? na  Is the patient on methotrexate? no  Current Complaints / other details:  Lives with daughter

## 2014-10-20 NOTE — Telephone Encounter (Signed)
S/W JUDY @ [facility] AND  GAVE NP APPT FOR 11/05 @ 10:30 W/DR. SHADAD.  REFERRING DR. MATTHEW ESKRIDGE DX- TRANSITIONAL CELL CARCINOMA OF BLADDER C/D ON 10/30 Bell Arthur NP APPT ON 11/05

## 2014-10-23 ENCOUNTER — Telehealth: Payer: Self-pay | Admitting: Oncology

## 2014-10-23 NOTE — Telephone Encounter (Signed)
C/D ON 11/02 FOR NP APPT ON 11/05

## 2014-10-24 ENCOUNTER — Encounter: Payer: Self-pay | Admitting: Radiation Oncology

## 2014-10-24 ENCOUNTER — Ambulatory Visit
Admission: RE | Admit: 2014-10-24 | Discharge: 2014-10-24 | Disposition: A | Discharge status: Home / Self Care | Payer: Medicare Other | Source: Ambulatory Visit

## 2014-10-24 ENCOUNTER — Ambulatory Visit
Admission: RE | Admit: 2014-10-24 | Discharge: 2014-10-24 | Disposition: A | Discharge status: Home / Self Care | Payer: Medicare Other | Source: Ambulatory Visit | Attending: Radiation Oncology | Admitting: Radiation Oncology

## 2014-10-24 DIAGNOSIS — Z510 Encounter for antineoplastic radiation therapy: Secondary | ICD-10-CM | POA: Insufficient documentation

## 2014-10-24 DIAGNOSIS — C679 Malignant neoplasm of bladder, unspecified: Secondary | ICD-10-CM | POA: Insufficient documentation

## 2014-10-24 HISTORY — DX: Malignant neoplasm of colon, unspecified: C18.9

## 2014-10-24 HISTORY — DX: Malignant neoplasm of bladder, unspecified: C67.9

## 2014-10-24 HISTORY — DX: Disorder of kidney and ureter, unspecified: N28.9

## 2014-10-24 NOTE — Addendum Note (Signed)
Encounter addended by: Andria Rhein, RN on: 10/24/2014 11:21 AM<BR>     Documentation filed: Charges VN

## 2014-10-24 NOTE — Progress Notes (Signed)
[facility] NEW PATIENT EVALUATION  Name: Julian Hale MRN: [id number]  Date:   10/24/2014           DOB: 06/25/1926  Status: outpatient   CC: Julian Haste, MD  Julian Aloe, MD , Julian Hale   REFERRING PHYSICIAN: Festus Aloe, MD   DIAGNOSIS: clinical stage T3 N0 M0 high grade urothelial carcinoma of the urinary bladder   HISTORY OF PRESENT ILLNESS:  Julian Hale is a 80 y.o. male who is seen today through the courtesy of Dr. Festus Hale for evaluation of his high grade urothelial carcinoma of the urinary bladder. The patient gives a history of intermittent gross hematuria beginning approximately 1 year ago. A CT scan on 10/21/2013 showed a filling defect within the anterolateral aspect of the urinary bladder on the right side suspicious for neoplasm. He was initially seen by Dr. Karsten Hale in the fall of 2014 at which time he underwent cystoscopy for gross hematuria.  Biopsies from 11/04/2013 were diagnostic for high-grade urothelial carcinoma with extensive muscle invasion. He elected no further treatment. He was then seen by Dr. Junious Hale who performed TURB on 09/24/2014 after  further gross hematuria. He noted a large necrotic bleeding tumor involving the entire right posterior lateral wall up towards the dome and extending to the trigone and involving the right ureteral orifice. He underwent partial resection and placement of a right ureteral stent. A follow-up CT scan on 10/16/2014, after presenting to the emergency room for lower abdominal discomfort, showed a right-sided bladder mass with suspicion of transmural spread of disease and possibly direct extension to the prostate. There was no pelvic adenopathy  He is currently asymptomatic except for discomfort along his coccyx. He denies hematuria.  PREVIOUS RADIATION THERAPY: No   PAST MEDICAL HISTORY:  has a past medical history of ASHD (arteriosclerotic heart disease);  COPD (chronic obstructive pulmonary disease); GERD (gastroesophageal reflux disease); BPH (benign prostatic hyperplasia); Stroke (01/1999); Hypertension; Hypercholesteremia; Exertional dyspnea; S/P angioplasty with stent, to high grade Lt external iliac atery  11/16/12 (11/17/2012); Hematoma of groin, at cath site (11/17/2012); Acute blood loss anemia, lt groin hematoma (11/17/2012); CAD (coronary artery disease); S/P CABG (coronary artery bypass graft) (2006); PAD (peripheral artery disease); Colon cancer; Bladder cancer (09/2013); and Renal lesion.     PAST SURGICAL HISTORY:  Past Surgical History  Procedure Laterality Date  . Cholecystectomy open    . Iliac artery stent  11/16/2012    95% focal stenosis in L external iliac artery, stented w/ a 56mmx3cm Cordis Smart Nitinol self-expanding stent, resulting in reduction of a 95% stenosis to 0% residual.  . Colectomy  ~ 2006    "partial" (11/16/2012)  . Lea doppler  12/17/2012    Normal  . Cardiovascular stress test  10/15/2012    Lexiscan. Basal to Mid Inferolateral bowel artifact. No Lexiscan EKG changes, non-diagnostic for ischemia. No reversible ischemia.  . Transthoracic echocardiogram  04/12/2013    EF 55-60%, LA moderately dilated,   . Femoral-femoral bypass graft    . Eye surgery      bilateral cataracts  . Hernia repair      left  . Transurethral resection of bladder tumor with gyrus (turbt-gyrus) N/A 11/04/2013    Procedure: TRANSURETHRAL RESECTION OF BLADDER TUMOR WITH GYRUS (TURBT-GYRUS);  Surgeon: Julian Jabs, MD;  Location: [facility];  Service: Urology;  Laterality: N/A;  . Transurethral resection of bladder tumor N/A 09/24/2014    Procedure: TRANSURETHRAL RESECTION OF BLADDER TUMOR (TURBT) GREATER  THAN 5 CM;  Surgeon: Julian Aloe, MD;  Location: [facility];  Service: Urology;  Laterality: N/A;  . Cystoscopy Right 09/24/2014    Procedure: CYSTOSCOPY WITH CLOT EVACUATION, RIGHT RETROGRADE, RIGHT URETERAL STENT PLACEMENT;  Surgeon:  Julian Aloe, MD;  Location: [facility];  Service: Urology;  Laterality: Right;  . Upper gi endoscopy  09/08/2014    Esophageal stricture     FAMILY HISTORY: family history includes Cancer in his sister; Stroke in his father. His father died of a stroke at 50. His mother died from "old age" at 69.   SOCIAL HISTORY:  reports that he quit smoking about 30 years ago. His smoking use included Cigarettes. He has a 60 pack-year smoking history. He has never used smokeless tobacco. He reports that he does not drink alcohol or use illicit drugs.  Widower for the past 14 years, 2 children. He worked as a Hospital doctor.   ALLERGIES: Review of patient's allergies indicates no known allergies.   MEDICATIONS:  Current Outpatient Prescriptions  Medication Sig Dispense Refill  . feeding supplement, ENSURE COMPLETE, (ENSURE COMPLETE) LIQD Take 237 mLs by mouth 3 (three) times daily between meals. 60 Bottle 0  . finasteride (PROSCAR) 5 MG tablet Take 1 tablet (5 mg total) by mouth daily. 90 tablet 3  . HYDROcodone-acetaminophen (NORCO/VICODIN) 5-325 MG per tablet Take 1-2 tablets by mouth every 4 (four) hours as needed for moderate pain or severe pain. 30 tablet 0  . isosorbide mononitrate (IMDUR) 30 MG 24 hr tablet Take 1 tablet (30 mg total) by mouth every morning. 90 tablet 3  . metoprolol tartrate (LOPRESSOR) 25 MG tablet Take 1 tablet (25 mg total) by mouth 2 (two) times daily. 60 tablet 0  . omeprazole (PRILOSEC) 20 MG capsule Take 20 mg by mouth daily.    . simvastatin (ZOCOR) 80 MG tablet Take 40 mg by mouth daily.    . solifenacin (VESICARE) 10 MG tablet Take 10 mg by mouth daily.    Marland Kitchen terazosin (HYTRIN) 1 MG capsule Take 1 mg by mouth 2 (two) times daily.     No current facility-administered medications for this encounter.     REVIEW OF SYSTEMS:  Pertinent items are noted in HPI.    PHYSICAL EXAM:  height is 5\' 10"  (1.778 m) and weight is 145 lb 8 oz (65.998 kg). His oral temperature is 97.6  F (36.4 C). His blood pressure is 112/47 and his pulse is 60. His respiration is 20.   Alert and oriented 80 year old white male appearing his stated age. Nodes: There is no palpable cervical, supraclavicular, or inguinal lymphadenopathy. Chest: Lungs clear. Abdomen: Without masses organomegaly. Back: There is focal tenderness along his coccyx. There is no mass. Genitalia: Unremarkable to inspection.extremities: Without edema.   LABORATORY DATA:  Lab Results  Component Value Date   WBC 8.6 10/16/2014   HGB 9.9* 10/16/2014   HCT 31.2* 10/16/2014   MCV 89.9 10/16/2014   PLT 240 10/16/2014   Lab Results  Component Value Date   NA 136* 10/16/2014   K 4.4 10/16/2014   CL 101 10/16/2014   CO2 22 10/16/2014   Lab Results  Component Value Date   ALT 9 10/16/2014   AST 14 10/16/2014   ALKPHOS 110 10/16/2014   BILITOT 0.4 10/16/2014      IMPRESSION: Clinical stage T3 high grade urothelial carcinoma of the urinary bladder. I explained to the patient and his daughter that he has incurable bladder cancer. However, he may benefit from  palliative radiation therapy to delay disease progression and further episodes of gross hematuria/urinary obstruction. I offered him 3-4 weeks of radiation therapy. He will meet with Dr. Alen Blew  later this week to discuss sensitizing chemotherapy. I discussed the potential acute and late toxicities of radiation therapy which should be relatively well tolerated. I would treat his primary tumor but not his regional lymph nodes. He is scheduled for CT simulation tomorrow, and I can begin his radiation therapy later next week. Consent is signed today.   PLAN: As discussed above.   I spent 60 minutes minutes face to face with the patient and more than 50% of that time was spent in counseling and/or coordination of care.

## 2014-10-25 ENCOUNTER — Ambulatory Visit
Admission: RE | Admit: 2014-10-25 | Discharge: 2014-10-25 | Disposition: A | Discharge status: Home / Self Care | Payer: Medicare Other | Source: Ambulatory Visit | Attending: Radiation Oncology | Admitting: Radiation Oncology

## 2014-10-25 DIAGNOSIS — C678 Malignant neoplasm of overlapping sites of bladder: Secondary | ICD-10-CM

## 2014-10-25 DIAGNOSIS — Z510 Encounter for antineoplastic radiation therapy: Secondary | ICD-10-CM | POA: Diagnosis not present

## 2014-10-25 NOTE — Progress Notes (Signed)
Complex simulation/treatment planning note: The patient was taken to the CT simulator. A Vac lock immobilization device was constructed. His pelvis was scanned. I chose and isocenter along the right bladder. I contoured his bladder tumor (CTV) and expanded this by 0.5 cm to create PTV40.5 which will receive 4050 cGy in 18 sessions. He may receive concomitant chemotherapy and this would constitute a special treatment procedure with  expected increased treatment related toxicity. He is now ready for 3-D simulation.

## 2014-10-26 ENCOUNTER — Other Ambulatory Visit: Payer: Medicare Other

## 2014-10-26 ENCOUNTER — Encounter: Payer: Self-pay | Admitting: Oncology

## 2014-10-26 ENCOUNTER — Encounter: Payer: Self-pay | Admitting: Radiation Oncology

## 2014-10-26 ENCOUNTER — Ambulatory Visit: Payer: Medicare Other

## 2014-10-26 ENCOUNTER — Ambulatory Visit (HOSPITAL_BASED_OUTPATIENT_CLINIC_OR_DEPARTMENT_OTHER): Payer: Medicare Other | Admitting: Oncology

## 2014-10-26 VITALS — BP 137/44 | HR 58 | Temp 97.3°F | Resp 18 | Ht 65.5 in | Wt 146.6 lb

## 2014-10-26 DIAGNOSIS — Z510 Encounter for antineoplastic radiation therapy: Secondary | ICD-10-CM | POA: Diagnosis not present

## 2014-10-26 DIAGNOSIS — C679 Malignant neoplasm of bladder, unspecified: Secondary | ICD-10-CM

## 2014-10-26 NOTE — Progress Notes (Signed)
3-D simulation note: The patient completed 3-D simulation today for treatment to his urinary bladder. He is set up to LAO, RPO, and right lateral fields. 3 separate and unique multileaf collimators were designed to conform the field. Dose volume histograms were obtained for the target structures and also bladder, rectum, and bowel. We met our departmental guidelines. I prescribing 4050 cGy in 18 sessions utilizing 15 MV photons. If he receives concomitant chemotherapy,   this constitutes a special treatment procedure, likely leading to increased hematologic and urinary/bowel toxicity.

## 2014-10-26 NOTE — Progress Notes (Signed)
Please see consult note.  

## 2014-10-26 NOTE — Consult Note (Signed)
Reason for Referral: bladder cancer.  HPI: Julian Hale is a pleasant 80 year old gentleman native of [place] currently lives in this area with his daughter. He is a gentleman with history of COPD, hypertension and coronary artery disease among other comorbid conditions. He was in his usual state of health till he started developing intermittent gross hematuria beginning approximately 1 year ago. A CT scan on 10/21/2013 showed a filling defect within the anterolateral aspect of the urinary bladder on the right side suspicious for neoplasm. He was initially seen by Dr. Karsten Ro and   underwent cystoscopy for gross hematuria. Biopsies from 11/04/2013 were diagnostic for high-grade urothelial carcinoma with extensive muscle invasion. He elected no further treatment. He was then seen by Dr. Junious Silk who performed TURB on 09/24/2014 after further gross hematuria. He noted a large necrotic bleeding tumor involving the entire right posterior lateral wall up towards the dome and extending to the trigone and involving the right ureteral orifice. He underwent partial resection and placement of a right ureteral stent. A follow-up CT scan on 10/16/2014, after presenting to the emergency room for lower abdominal discomfort, showed a right-sided bladder mass with suspicion of transmural spread of disease and possibly direct extension to the prostate. There was no pelvic adenopathyor any systemic disease involvement. He was evaluated by Dr. Valere Dross and set up for radiation therapy in the near future. I was asked to comment about the role of systemic chemotherapy in his care.  Clinically, he is rather weak and slowly debilitated. He ambulates short distances within his house but have very limited performance status. He has pelvic pain especially sitting down for extended period of time. He also had some problems with dysphagia. He does not report any headaches, blurry vision or syncope. He does not report any fevers,  chills, sweats but does report weight loss and decline in his appetite. His performance status is poor and declining. He does not report any chest pain, palpitation, orthopnea, leg edema. He does not report any cough, shortness of breath, hemoptysis or hematemesis. He does not report any nausea, vomiting but does report poor by mouth intake and dysphagia. He does not report any frequency, urgency and his hematuria have resolved. He does not report any skeletal complaints rest of his review of systems unremarkable.  Past Medical History  Diagnosis Date  . ASHD (arteriosclerotic heart disease)   . COPD (chronic obstructive pulmonary disease)   . GERD (gastroesophageal reflux disease)   . BPH (benign prostatic hyperplasia)   . Stroke 01/1999    "mild" (11/16/2012)  . Hypertension   . Hypercholesteremia   . Exertional dyspnea   . S/P angioplasty with stent, to high grade Lt external iliac atery  11/16/12 11/17/2012  . Hematoma of groin, at cath site 11/17/2012  . Acute blood loss anemia, lt groin hematoma 11/17/2012  . CAD (coronary artery disease)   . S/P CABG (coronary artery bypass graft) 2006  . PAD (peripheral artery disease)   . Colon cancer   . Bladder cancer 09/2013    TURBT, trigone of bladder  . Renal lesion     left  :  Past Surgical History  Procedure Laterality Date  . Cholecystectomy open    . Iliac artery stent  11/16/2012    95% focal stenosis in L external iliac artery, stented w/ a 7mmx3cm Cordis Smart Nitinol self-expanding stent, resulting in reduction of a 95% stenosis to 0% residual.  . Colectomy  ~ 2006    "partial" (11/16/2012)  . [place] Nunnery  doppler  12/17/2012    Normal  . Cardiovascular stress test  10/15/2012    Lexiscan. Basal to Mid Inferolateral bowel artifact. No Lexiscan EKG changes, non-diagnostic for ischemia. No reversible ischemia.  . Transthoracic echocardiogram  04/12/2013    EF 55-60%, LA moderately dilated,   . Femoral-femoral bypass graft    . Eye  surgery      bilateral cataracts  . Hernia repair      left  . Transurethral resection of bladder tumor with gyrus (turbt-gyrus) N/A 11/04/2013    Procedure: TRANSURETHRAL RESECTION OF BLADDER TUMOR WITH GYRUS (TURBT-GYRUS);  Surgeon: Claybon Jabs, MD;  Location: [facility];  Service: Urology;  Laterality: N/A;  . Transurethral resection of bladder tumor N/A 09/24/2014    Procedure: TRANSURETHRAL RESECTION OF BLADDER TUMOR (TURBT) GREATER THAN 5 CM;  Surgeon: Festus Aloe, MD;  Location: [facility];  Service: Urology;  Laterality: N/A;  . Cystoscopy Right 09/24/2014    Procedure: CYSTOSCOPY WITH CLOT EVACUATION, RIGHT RETROGRADE, RIGHT URETERAL STENT PLACEMENT;  Surgeon: Festus Aloe, MD;  Location: [facility];  Service: Urology;  Laterality: Right;  . Upper gi endoscopy  09/08/2014    Esophageal stricture  :  Current Outpatient Prescriptions  Medication Sig Dispense Refill  . albuterol (PROVENTIL) (2.5 MG/3ML) 0.083% nebulizer solution Inhale 2.5 mg into the lungs daily.    . feeding supplement, ENSURE COMPLETE, (ENSURE COMPLETE) LIQD Take 237 mLs by mouth 3 (three) times daily between meals. 60 Bottle 0  . finasteride (PROSCAR) 5 MG tablet Take 1 tablet (5 mg total) by mouth daily. 90 tablet 3  . HYDROcodone-acetaminophen (NORCO/VICODIN) 5-325 MG per tablet Take 1-2 tablets by mouth every 4 (four) hours as needed for moderate pain or severe pain. 30 tablet 0  . isosorbide mononitrate (IMDUR) 30 MG 24 hr tablet Take 1 tablet (30 mg total) by mouth every morning. 90 tablet 3  . metoprolol tartrate (LOPRESSOR) 25 MG tablet Take 1 tablet (25 mg total) by mouth 2 (two) times daily. 60 tablet 0  . Omeprazole (PRILOSEC PO) Take 1 tablet by mouth daily.    Marland Kitchen omeprazole (PRILOSEC) 20 MG capsule Take 20 mg by mouth daily.    . simvastatin (ZOCOR) 80 MG tablet Take 40 mg by mouth daily.    . solifenacin (VESICARE) 10 MG tablet Take 10 mg by mouth daily.    Marland Kitchen terazosin (HYTRIN) 1 MG capsule Take 1 mg by  mouth 2 (two) times daily.     No current facility-administered medications for this visit.       No Known Allergies:  Family History  Problem Relation Age of Onset  . Stroke Father   . Cancer Sister   :  History   Social History  . Marital Status: Widowed    Spouse Name: N/A    Number of Children: N/A  . Years of Education: N/A   Occupational History  . Not on file.   Social History Main Topics  . Smoking status: Former Smoker -- 2.00 packs/day for 30 years    Types: Cigarettes    Quit date: 07/31/1984  . Smokeless tobacco: Never Used     Comment: 11/16/2012 "quit smoking cigarettes in the 1970's"  . Alcohol Use: No     Comment: 11/16/2012 "wine cooler 4-5 nights/wk", 10/24/14 does not drink alcohol  . Drug Use: No  . Sexual Activity: No   Other Topics Concern  . 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  :  Pertinent items are noted in HPI.  Exam: ECOG 2 Blood pressure 137/44, pulse 58, temperature 97.3 F (36.3 C), temperature source Oral, resp. rate 18, weight 146 lb 9.6 oz (66.497 kg), SpO2 100 %. General appearance: alert and cooperative Throat: lips, mucosa, and tongue normal; teeth and gums normal Neck: no adenopathy Back: negative Resp: clear to auscultation bilaterally Cardio: regular rate and rhythm, S1, S2 normal, no murmur, click, rub or gallop GI: soft, non-tender; bowel sounds normal; no masses,  no organomegaly Extremities: extremities normal, atraumatic, no cyanosis or edema Pulses: 2+ and symmetric Skin: Skin color, texture, turgor normal. No rashes or lesions     Ct Abdomen Pelvis W Contrast  10/16/2014   CLINICAL DATA:  Lower abdominal pain. Last bowel movement 4 days ago. Hernia repair. Cholecystectomy. Partial colectomy in 2006. TURP in 2014 in 2015. Trans roots resection of bladder tumor 11/04/2013. 09/24/2014.LLQ pain R10.32 (ICD-10-CM)  EXAM:  CT ABDOMEN AND PELVIS WITH CONTRAST  TECHNIQUE: Multidetector CT imaging of the abdomen and pelvis was performed using the standard protocol following bolus administration of intravenous contrast.  CONTRAST:  13mL OMNIPAQUE IOHEXOL 300 MG/ML SOLN, 60mL OMNIPAQUE IOHEXOL 300 MG/ML SOLN  COMPARISON:  09/24/2014  FINDINGS: Lower chest: 4 mm right lower lobe lung nodule on image 5 of series 4. Smaller right middle lobe nodule at 2 mm on image 6. These are similar. Mild cardiomegaly with right coronary artery atherosclerosis. A moderate hiatal hernia.  Hepatobiliary: Normal liver. Cholecystectomy, without biliary ductal dilatation.  Pancreas: Normal pancreas for age.  Spleen: Normal  Adrenals/Urinary Tract: Normal adrenal glands. Bilateral renal cortical thinning. Bilateral renal cysts and too small to characterize lesions. The right-sided ureteric stent originates in the right extrarenal pelvis and terminates in the right-sided the urinary bladder. No hydronephrosis.  Irregular right-sided bladder wall thickening including on image 66 of series 2. Suspicion of transmural extension, including on image 62 of series 2. Inferiorly, bladder wall thickening is intimately associated with the prostate, and direct tumor involvement cannot be excluded. Example image 60 of series 2. The extent of bladder tumor is difficult to compare to the prior exam, in which there was extensive complicating hemorrhage.  Stomach/Bowel: Normal distal stomach. Transverse duodenal diverticulum. Surgical changes at the sigmoid colon. Extensive colonic diverticulosis. Normal terminal ileum. Normal small bowel without abdominal ascites.  Vascular/Lymphatic: Multiple right renal arteries. Aortic and branch vessel atherosclerosis. No retroperitoneal or retrocrural adenopathy. The right common iliac artery is likely thrombosed versus severely stenotic, including on image 44 of series 2. Reconstituted via from/from bypass. No pelvic adenopathy.   Reproductive: Prostate otherwise within normal limits.  Other: No significant free fluid. Fat containing left inguinal hernia.  Musculoskeletal: Osteopenia. Remote right inferior pubic ramus fracture. Superior endplate compression deformities at L3 and L4 are not significantly changed. Convex left lumbar spine curvature.  IMPRESSION: 1. No evidence of bowel obstruction or other explanation for left lower quadrant pain. 2. Right-sided bladder mass or masses. Suspicion of transmural spread of disease and possible direct extension to the prostate. 3. No evidence of abdominal pelvic adenopathy. 4. Right-sided ureteric stent in place without hydronephrosis. 5. Moderate hiatal hernia. 6. Tiny nonspecific right lung base nodules. Cannot exclude early metastatic disease. Recommend attention on follow-up.   Electronically Signed   By: Abigail Miyamoto M.D.   On: 10/16/2014 20:50    Assessment and Plan:   80 year old gentleman with the following issues:  1. Muscle invasive bladder cancer presented with gross hematuria and status post TURBT on 09/24/2014. The tumor was found to be high-grade invasive urothelial carcinoma with muscularis propria involved by tumor. The clinical stage is T3 N0. CT scan of the abdomen and pelvis on 10/16/2014 was reviewed today and showed right-sided bladder mass or masses with direct extension into the prostate. There is no pelvic adenopathy at that time. He also has a right-sided ureteral extent without hydronephrosis. He is planning to have palliative radiation therapy under the care of Dr. Valere Dross in the near future.  The natural course of this disease was discussed with the patient and his daughter. At this point, any treatment modality would be palliative in nature. I see no curative intervention at this time given the nature of his tumor and possible prostatic involvement. I agree with palliative radiation therapy in attempt to control his disease and limit bleeding complications. I  discussed the rationale of using systemic chemotherapy concomitantly with radiation. Different drugs discussed today including a platinum-based regimens, 5-FU-based regimens and oral Xeloda were discussed. Risks and benefits of all these drugs were discussed and complications were reiterated. Overall, I see very little value to adding systemic chemotherapy in his case. I think the complication rate outweighs any potential little benefit.  I feel that his prognosis is rather poor with very little chance of curative intent. Adding systemic chemotherapy will probably worsen his quality of life and adds very little value. The patient and his daughter agrees to this strategy at this point.  2. Overall prognosis: This was discussed extensively today and I feel he has potentially limited life expectancy. Despite the fact that this tumor has not progressed systemically, he is showing signs of deterioration with poor performance status and failure to thrive. I discussed the role of hospice which did daughter is very aware of their services. She understands that the next up after radiation would be hospice if he continues to decline.  All his questions and his daughter's questions were answered today to their satisfaction. I will be happy to see him in the future as needed. I gave them my contact formation and therefore to contact me at any time with questions or concerns.

## 2014-10-26 NOTE — Progress Notes (Signed)
Checked in new patient with no financial issues prior to seeing the dr.

## 2014-10-27 ENCOUNTER — Encounter: Payer: Self-pay | Admitting: *Deleted

## 2014-10-27 NOTE — Progress Notes (Signed)
[facility] Psychosocial Distress Screening Clinical Social Work  Clinical Social Work was referred by distress screening protocol.  The patient scored a 6 on the Psychosocial Distress Thermometer which indicates moderate distress. Clinical Social Worker attempted to contact patient to assess for distress and other psychosocial needs.  Patient is unable to hear on the phone, CSW spoke with patient's daughter Langley Gauss.  Langley Gauss shared she feels her father is feeling much better after meeting with Dr. Alen Blew.  She reported he was able to hear Dr. Alen Blew and felt he gained a better understanding of his disease.  She shared he is well supported by his family.  He lives with his daughter and granddaughter and has two dogs.  CSW encouraged patient/family to contact CSW if they have questions/needs or would like information on support programs/services.  ONCBCN DISTRESS SCREENING 10/24/2014  Screening Type Initial Screening  Distress experienced in past week (1-10) 6  Emotional problem type Nervousness/Anxiety;Adjusting to illness  Physical Problem type Pain;Getting around  Other "pain" listed as most distressing, contact by phone, left vm for L Mullis, SW re: pt's responses to depression flowsheet    Clinical Social Worker follow up needed: No.  If yes, follow up plan:  Polo Riley, MSW, LCSW, OSW-C Clinical Social Worker [facility] [phone]

## 2014-11-01 ENCOUNTER — Ambulatory Visit
Admission: RE | Admit: 2014-11-01 | Discharge: 2014-11-01 | Disposition: A | Discharge status: Home / Self Care | Payer: Medicare Other | Source: Ambulatory Visit | Attending: Radiation Oncology | Admitting: Radiation Oncology

## 2014-11-01 ENCOUNTER — Encounter: Payer: Self-pay | Admitting: Radiation Oncology

## 2014-11-01 DIAGNOSIS — Z510 Encounter for antineoplastic radiation therapy: Secondary | ICD-10-CM | POA: Diagnosis not present

## 2014-11-01 NOTE — Progress Notes (Signed)
Simulation Verification Note  The patient was brought to the treatment unit and placed in the planned treatment position. The clinical setup was verified. Then port films were obtained and uploaded to the radiation oncology medical record software.  The treatment beams were carefully compared against the planned radiation fields. The position location and shape of the radiation fields was reviewed. They targeted volume of tissue appears to be appropriately covered by the radiation beams. Organs at risk appear to be excluded as planned.  Based on my personal review, I approved the simulation verification. The patient's treatment will proceed as planned.  -----------------------------------  Elinore Shults, MD  

## 2014-11-02 ENCOUNTER — Ambulatory Visit
Admission: RE | Admit: 2014-11-02 | Discharge: 2014-11-02 | Disposition: A | Discharge status: Home / Self Care | Payer: Medicare Other | Source: Ambulatory Visit | Attending: Radiation Oncology | Admitting: Radiation Oncology

## 2014-11-02 ENCOUNTER — Ambulatory Visit: Payer: Medicare Other

## 2014-11-02 ENCOUNTER — Inpatient Hospital Stay
Admission: RE | Admit: 2014-11-02 | Discharge: 2014-11-02 | Disposition: A | Discharge status: Home / Self Care | Payer: Self-pay | Source: Ambulatory Visit | Attending: Radiation Oncology | Admitting: Radiation Oncology

## 2014-11-02 DIAGNOSIS — C679 Malignant neoplasm of bladder, unspecified: Secondary | ICD-10-CM

## 2014-11-02 DIAGNOSIS — Z510 Encounter for antineoplastic radiation therapy: Secondary | ICD-10-CM | POA: Diagnosis not present

## 2014-11-02 NOTE — Progress Notes (Addendum)
Patient education completed with patient and grand daughter. Gave patient "Radiation and You" booklet with all pertinent information marked and discussed, re: diarrhea/rectal discomfort/care, fatigue, urinary/bladder irritation/management, nutrition, pain. Pt and grand daughter verbalized no questions and verbalized understanding. Teach back method used.

## 2014-11-02 NOTE — Addendum Note (Signed)
Encounter addended by: Andria Rhein, RN on: 11/02/2014  2:02 PM<BR>     Documentation filed: Notes Section, Chief Complaint Section

## 2014-11-03 ENCOUNTER — Ambulatory Visit: Payer: Medicare Other

## 2014-11-06 ENCOUNTER — Ambulatory Visit: Payer: Medicare Other | Admitting: Radiation Oncology

## 2014-11-06 ENCOUNTER — Ambulatory Visit: Payer: Medicare Other

## 2014-11-06 ENCOUNTER — Ambulatory Visit
Admission: RE | Admit: 2014-11-06 | Discharge: 2014-11-06 | Disposition: A | Discharge status: Home / Self Care | Payer: Medicare Other | Source: Ambulatory Visit | Attending: Radiation Oncology | Admitting: Radiation Oncology

## 2014-11-06 ENCOUNTER — Encounter: Payer: Self-pay | Admitting: Radiation Oncology

## 2014-11-06 VITALS — BP 125/40 | HR 63 | Temp 98.0°F | Resp 20 | Ht 65.5 in | Wt 148.6 lb

## 2014-11-06 DIAGNOSIS — Z510 Encounter for antineoplastic radiation therapy: Secondary | ICD-10-CM | POA: Diagnosis not present

## 2014-11-06 DIAGNOSIS — C678 Malignant neoplasm of overlapping sites of bladder: Secondary | ICD-10-CM

## 2014-11-06 NOTE — Progress Notes (Signed)
Julian Hale has completed 1 fraction to his bladder.  He reports urinating frequently. He reports wearing a "diaper' at night that is wet in the morning.  He denies hematuria.  He denies any bowel issues.

## 2014-11-06 NOTE — Progress Notes (Signed)
Weekly Management Note:  Site:bladder Current Dose:  450  cGy Projected Dose: 4050  cGy  Narrative: The patient is seen today for routine under treatment assessment. CBCT/MVCT images/port films were reviewed. The chart was reviewed.   He is without new complaints today. No further hematuria. He does have urinary urgency/frequency which is unchanged. No GI difficulties.  Physical Examination:  Filed Vitals:   11/06/14 1714  BP: 125/40  Pulse: 63  Temp: 98 F (36.7 C)  Resp: 20  .  Weight: 148 lb 9.6 oz (67.405 kg). No change.  Impression: Tolerating radiation therapy well.  Plan: Continue radiation therapy as planned.

## 2014-11-07 ENCOUNTER — Ambulatory Visit
Admission: RE | Admit: 2014-11-07 | Discharge: 2014-11-07 | Disposition: A | Discharge status: Home / Self Care | Payer: Medicare Other | Source: Ambulatory Visit | Attending: Radiation Oncology | Admitting: Radiation Oncology

## 2014-11-07 ENCOUNTER — Ambulatory Visit: Payer: Medicare Other

## 2014-11-07 DIAGNOSIS — Z510 Encounter for antineoplastic radiation therapy: Secondary | ICD-10-CM | POA: Diagnosis not present

## 2014-11-08 ENCOUNTER — Ambulatory Visit
Admission: RE | Admit: 2014-11-08 | Discharge: 2014-11-08 | Disposition: A | Discharge status: Home / Self Care | Payer: Medicare Other | Source: Ambulatory Visit | Attending: Radiation Oncology | Admitting: Radiation Oncology

## 2014-11-08 ENCOUNTER — Ambulatory Visit: Payer: Medicare Other

## 2014-11-08 DIAGNOSIS — Z510 Encounter for antineoplastic radiation therapy: Secondary | ICD-10-CM | POA: Diagnosis not present

## 2014-11-09 ENCOUNTER — Ambulatory Visit: Payer: Medicare Other

## 2014-11-09 ENCOUNTER — Ambulatory Visit
Admission: RE | Admit: 2014-11-09 | Discharge: 2014-11-09 | Disposition: A | Discharge status: Home / Self Care | Payer: Medicare Other | Source: Ambulatory Visit | Attending: Radiation Oncology | Admitting: Radiation Oncology

## 2014-11-09 DIAGNOSIS — Z510 Encounter for antineoplastic radiation therapy: Secondary | ICD-10-CM | POA: Diagnosis not present

## 2014-11-10 ENCOUNTER — Ambulatory Visit: Payer: Medicare Other

## 2014-11-10 ENCOUNTER — Ambulatory Visit
Admission: RE | Admit: 2014-11-10 | Discharge: 2014-11-10 | Disposition: A | Discharge status: Home / Self Care | Payer: Medicare Other | Source: Ambulatory Visit | Attending: Radiation Oncology | Admitting: Radiation Oncology

## 2014-11-10 DIAGNOSIS — Z510 Encounter for antineoplastic radiation therapy: Secondary | ICD-10-CM | POA: Diagnosis not present

## 2014-11-12 ENCOUNTER — Ambulatory Visit
Admission: RE | Admit: 2014-11-12 | Discharge: 2014-11-12 | Disposition: A | Discharge status: Home / Self Care | Payer: Medicare Other | Source: Ambulatory Visit | Attending: Radiation Oncology | Admitting: Radiation Oncology

## 2014-11-12 DIAGNOSIS — Z510 Encounter for antineoplastic radiation therapy: Secondary | ICD-10-CM | POA: Diagnosis not present

## 2014-11-13 ENCOUNTER — Ambulatory Visit: Payer: Medicare Other

## 2014-11-13 ENCOUNTER — Ambulatory Visit
Admission: RE | Admit: 2014-11-13 | Discharge: 2014-11-13 | Disposition: A | Discharge status: Home / Self Care | Payer: Medicare Other | Source: Ambulatory Visit | Attending: Radiation Oncology | Admitting: Radiation Oncology

## 2014-11-13 VITALS — BP 132/41 | HR 53 | Temp 97.5°F | Resp 20 | Wt 147.1 lb

## 2014-11-13 DIAGNOSIS — Z510 Encounter for antineoplastic radiation therapy: Secondary | ICD-10-CM | POA: Diagnosis not present

## 2014-11-13 DIAGNOSIS — C678 Malignant neoplasm of overlapping sites of bladder: Secondary | ICD-10-CM

## 2014-11-13 NOTE — Progress Notes (Signed)
Patient denies pain, fatigue. Daughter states his appetite has improved. He reports urinary frequency every 30 min and states "it burns when I urinate". He states his urine is yellow, denies odor. Pt has normal BM every day per daughter.

## 2014-11-13 NOTE — Progress Notes (Signed)
Weekly Management Note:  Site: Bladder CC: Dr. Jill Alexanders  Current Dose:  1800  cGy Projected Dose: 5329  cGy  Narrative: The patient is seen today for routine under treatment assessment. CBCT/MVCT images/port films were reviewed. The chart was reviewed.   His set up is excellent.  He is without new GU or GI difficulty.  He denies hematuria.  He continues to have some dysuria during urination but this is not particularly bothersome.  This has been present since his diagnosis.  His family is concerned about his blood pressure with his diastolic pressure in the 92E.  This appears to be chronic.  He is on antihypertensive medication.  He denies lightheadedness or syncopal episodes.  Physical Examination:  Filed Vitals:   11/13/14 1008  BP: 132/41  Pulse: 53  Temp: 97.5 F (36.4 C)  Resp: 20  .  Weight: 147 lb 1.6 oz (66.724 kg).  No change.  Impression: Tolerating palliative radiation therapy well.  He will visit Dr. Redmond School, as primary care physician to see if he should remain on his antihypertensives.   Plan: Continue radiation therapy as planned.

## 2014-11-14 ENCOUNTER — Ambulatory Visit
Admission: RE | Admit: 2014-11-14 | Discharge: 2014-11-14 | Disposition: A | Discharge status: Home / Self Care | Payer: Medicare Other | Source: Ambulatory Visit | Attending: Radiation Oncology | Admitting: Radiation Oncology

## 2014-11-14 ENCOUNTER — Ambulatory Visit (INDEPENDENT_AMBULATORY_CARE_PROVIDER_SITE_OTHER): Payer: Medicare Other | Admitting: Family Medicine

## 2014-11-14 ENCOUNTER — Ambulatory Visit: Payer: Medicare Other

## 2014-11-14 ENCOUNTER — Encounter: Payer: Self-pay | Admitting: Family Medicine

## 2014-11-14 VITALS — BP 120/50 | HR 56 | Wt 146.0 lb

## 2014-11-14 DIAGNOSIS — I959 Hypotension, unspecified: Secondary | ICD-10-CM

## 2014-11-14 DIAGNOSIS — Z510 Encounter for antineoplastic radiation therapy: Secondary | ICD-10-CM | POA: Diagnosis not present

## 2014-11-14 NOTE — Patient Instructions (Addendum)
Stop metoprolol and terazosin. Keep monitoring the blood pressure

## 2014-11-14 NOTE — Progress Notes (Signed)
   Subjective:    Patient ID: Julian Hale, male    DOB: August 15, 1926, 80 y.o.   MRN: [id number]  HPI He is presently being treated for a bladder cancer with radiation. He did bring in blood pressure readings which did show his diastolic in the 40 range. Presently he is having no syncopal episodes or weakness.   Review of Systems     Objective:   Physical Exam Alert and in no distress. Blood pressure is recorded.       Assessment & Plan:  Hypotension, unspecified hypotension type  I will have him stop his blood pressure medications and monitor the blood pressure. He will call if further difficulty.

## 2014-11-15 ENCOUNTER — Ambulatory Visit: Payer: Medicare Other

## 2014-11-15 ENCOUNTER — Ambulatory Visit
Admission: RE | Admit: 2014-11-15 | Discharge: 2014-11-15 | Disposition: A | Discharge status: Home / Self Care | Payer: Medicare Other | Source: Ambulatory Visit | Attending: Radiation Oncology | Admitting: Radiation Oncology

## 2014-11-15 DIAGNOSIS — Z510 Encounter for antineoplastic radiation therapy: Secondary | ICD-10-CM | POA: Diagnosis not present

## 2014-11-17 ENCOUNTER — Ambulatory Visit: Payer: Medicare Other

## 2014-11-20 ENCOUNTER — Ambulatory Visit
Admission: RE | Admit: 2014-11-20 | Discharge: 2014-11-20 | Disposition: A | Discharge status: Home / Self Care | Payer: Medicare Other | Source: Ambulatory Visit | Attending: Radiation Oncology | Admitting: Radiation Oncology

## 2014-11-20 ENCOUNTER — Other Ambulatory Visit: Payer: Self-pay | Admitting: Radiation Oncology

## 2014-11-20 ENCOUNTER — Encounter: Payer: Self-pay | Admitting: Radiation Oncology

## 2014-11-20 ENCOUNTER — Ambulatory Visit: Payer: Medicare Other

## 2014-11-20 VITALS — BP 132/56 | HR 75 | Temp 97.7°F | Resp 22 | Wt 146.0 lb

## 2014-11-20 DIAGNOSIS — N3000 Acute cystitis without hematuria: Secondary | ICD-10-CM | POA: Insufficient documentation

## 2014-11-20 DIAGNOSIS — C678 Malignant neoplasm of overlapping sites of bladder: Secondary | ICD-10-CM | POA: Diagnosis not present

## 2014-11-20 DIAGNOSIS — R300 Dysuria: Secondary | ICD-10-CM | POA: Diagnosis present

## 2014-11-20 DIAGNOSIS — Z510 Encounter for antineoplastic radiation therapy: Secondary | ICD-10-CM | POA: Diagnosis not present

## 2014-11-20 LAB — URINALYSIS, MICROSCOPIC - CHCC
Glucose: NEGATIVE mg/dL
Ketones: NEGATIVE mg/dL
Specific Gravity, Urine: 1.02 (ref 1.003–1.035)
pH: 6 (ref 4.6–8.0)

## 2014-11-20 MED ORDER — HYDROCODONE-ACETAMINOPHEN 5-325 MG PO TABS: 1.0000 | ORAL_TABLET | Freq: Four times a day (QID) | ORAL | Status: DC | PRN

## 2014-11-20 MED ORDER — SULFAMETHOXAZOLE-TRIMETHOPRIM 800-160 MG PO TABS: 1.0000 | ORAL_TABLET | Freq: Two times a day (BID) | ORAL | Status: DC

## 2014-11-20 NOTE — Progress Notes (Signed)
Weekly Management Note:  Site: Bladder  Current Dose:  2475  cGy Projected Dose: 4000  cGy  Narrative: The patient is seen today for routine under treatment assessment. CBCT/MVCT images/port films were reviewed. The chart was reviewed.   Over the weekend he noted worsening dysuria along with darkening of his urine with a reddish color and foul odor.  Denies fever.  No GI difficulty.  Physical Examination:  Filed Vitals:   11/20/14 1048  BP: 132/56  Pulse: 75  Temp: 97.7 F (36.5 C)  Resp: 22  .  Weight: 146 lb (66.225 kg).  No change.  Impression: Tolerating radiation therapy well, except for recent dysuria.  I'm concerned he may have a urinary tract infection.  He will have a urinalysis/C&S and I will get him started on Pyridium.  He may take hydrocodone when necessary as well.  Plan: Continue radiation therapy as planned.

## 2014-11-20 NOTE — Addendum Note (Signed)
Encounter addended by: Rexene Edison, MD on: 11/20/2014 12:02 PM<BR>     Documentation filed: Orders

## 2014-11-20 NOTE — Progress Notes (Signed)
Patient states he "had a terrible night last night". He states he was awake all night because when he voided in his Depends it "burned like fire all the way down", indicating from his lower abdomen to end of penis. He states "it was terrible". He states that he has voided twice since being here and has not had any burning at all. He has Hydrocodone on his med list but did not take it last night. He states he noticed that the urine in his Depends was "very dark", but he denies a reddish color or a foul odor. He denies other urinary issues. He denies other pain, fatigue, loss of appetite, bowel issues.

## 2014-11-20 NOTE — Progress Notes (Signed)
Chart note: The patient's urinalysis suggests that he has a urinary tract infection.  I will start him on trimethoprim/sulfa twice a day for 7 days.  Prescription has been e-prescribed.  Culture and sensitivities should be back this Wednesday.  I left a message with the patient.

## 2014-11-20 NOTE — Addendum Note (Signed)
Encounter addended by: Rexene Edison, MD on: 11/20/2014 12:07 PM<BR>     Documentation filed: Notes Section

## 2014-11-21 ENCOUNTER — Ambulatory Visit
Admission: RE | Admit: 2014-11-21 | Discharge: 2014-11-21 | Disposition: A | Discharge status: Home / Self Care | Payer: Medicare Other | Source: Ambulatory Visit | Attending: Radiation Oncology | Admitting: Radiation Oncology

## 2014-11-21 ENCOUNTER — Ambulatory Visit: Payer: Medicare Other

## 2014-11-21 DIAGNOSIS — Z510 Encounter for antineoplastic radiation therapy: Secondary | ICD-10-CM | POA: Diagnosis not present

## 2014-11-22 ENCOUNTER — Ambulatory Visit: Payer: Medicare Other

## 2014-11-22 ENCOUNTER — Ambulatory Visit
Admission: RE | Admit: 2014-11-22 | Discharge: 2014-11-22 | Disposition: A | Discharge status: Home / Self Care | Payer: Medicare Other | Source: Ambulatory Visit | Attending: Radiation Oncology | Admitting: Radiation Oncology

## 2014-11-22 ENCOUNTER — Other Ambulatory Visit: Payer: Self-pay | Admitting: Radiation Oncology

## 2014-11-22 DIAGNOSIS — N3000 Acute cystitis without hematuria: Secondary | ICD-10-CM

## 2014-11-22 DIAGNOSIS — Z510 Encounter for antineoplastic radiation therapy: Secondary | ICD-10-CM | POA: Diagnosis not present

## 2014-11-22 LAB — URINE CULTURE

## 2014-11-22 MED ORDER — AMPICILLIN 500 MG PO CAPS: 500.0000 mg | ORAL_CAPSULE | Freq: Four times a day (QID) | ORAL | Status: DC

## 2014-11-23 ENCOUNTER — Ambulatory Visit
Admission: RE | Admit: 2014-11-23 | Discharge: 2014-11-23 | Disposition: A | Discharge status: Home / Self Care | Payer: Medicare Other | Source: Ambulatory Visit | Attending: Radiation Oncology | Admitting: Radiation Oncology

## 2014-11-23 DIAGNOSIS — Z510 Encounter for antineoplastic radiation therapy: Secondary | ICD-10-CM | POA: Diagnosis not present

## 2014-11-24 ENCOUNTER — Ambulatory Visit
Admission: RE | Admit: 2014-11-24 | Discharge: 2014-11-24 | Disposition: A | Discharge status: Home / Self Care | Payer: Medicare Other | Source: Ambulatory Visit | Attending: Radiation Oncology | Admitting: Radiation Oncology

## 2014-11-24 ENCOUNTER — Ambulatory Visit: Payer: Medicare Other

## 2014-11-24 DIAGNOSIS — Z510 Encounter for antineoplastic radiation therapy: Secondary | ICD-10-CM | POA: Diagnosis not present

## 2014-11-27 ENCOUNTER — Ambulatory Visit
Admission: RE | Admit: 2014-11-27 | Discharge: 2014-11-27 | Disposition: A | Discharge status: Home / Self Care | Payer: Medicare Other | Source: Ambulatory Visit | Attending: Radiation Oncology | Admitting: Radiation Oncology

## 2014-11-27 ENCOUNTER — Ambulatory Visit: Payer: Medicare Other

## 2014-11-27 VITALS — BP 154/68 | HR 84 | Temp 97.8°F | Resp 20 | Wt 144.0 lb

## 2014-11-27 DIAGNOSIS — Z510 Encounter for antineoplastic radiation therapy: Secondary | ICD-10-CM | POA: Diagnosis not present

## 2014-11-27 DIAGNOSIS — C678 Malignant neoplasm of overlapping sites of bladder: Secondary | ICD-10-CM

## 2014-11-27 DIAGNOSIS — N3000 Acute cystitis without hematuria: Secondary | ICD-10-CM

## 2014-11-27 MED ORDER — OXYCODONE-ACETAMINOPHEN 5-325 MG PO TABS: 1.0000 | ORAL_TABLET | ORAL | Status: DC | PRN

## 2014-11-27 NOTE — Progress Notes (Signed)
Patient continues taking Ampicillin.and Pyridium OTC. He states his dysuria is worse. He states " I can hardly stand it." His daughter states his urine is orange due to pyridium. Patient is out of Hydrocodone but states it doesn't give him any relief of burning. Family is requesting another type of pain med. Patient denies bowel issues, loss of appetite. He is fatigued.

## 2014-11-27 NOTE — Progress Notes (Signed)
Weekly Management Note:  Site: Bladder Current Dose:  3600  cGy Projected Dose: 4000  cGy  Narrative: The patient is seen today for routine under treatment assessment. CBCT/MVCT images/port films were reviewed. The chart was reviewed.   He continues to have dysuria despite taking Pyridium.  Hydrocodone was not controlling his pain.  He is out of hydrocodone.  He did have a fall yesterday but was evaluated by EMR and was not taken to the hospital.  He continues with his ampicillin for his UTI.  He will finish his radiation therapy this Wednesday.  Physical Examination:  Filed Vitals:   11/27/14 1022  BP: 154/68  Pulse: 84  Temp: 97.8 F (36.6 C)  Resp: 20  .  Weight: 144 lb (65.318 kg).  No change.  Impression: Tolerating radiation therapy well.  I will go ahead and start him on oxycodone dispense #60 (Percocet 5/325).  I will repeat his urinalysis and culture this Wednesday.  Plan: Continue radiation therapy as planned.

## 2014-11-28 ENCOUNTER — Ambulatory Visit: Payer: Medicare Other

## 2014-11-28 ENCOUNTER — Ambulatory Visit
Admission: RE | Admit: 2014-11-28 | Discharge: 2014-11-28 | Disposition: A | Discharge status: Home / Self Care | Payer: Medicare Other | Source: Ambulatory Visit | Attending: Radiation Oncology | Admitting: Radiation Oncology

## 2014-11-28 DIAGNOSIS — Z510 Encounter for antineoplastic radiation therapy: Secondary | ICD-10-CM | POA: Diagnosis not present

## 2014-11-28 NOTE — Progress Notes (Signed)
Spoke with patient re: new pain medication. He states "I slept all night." He states the medication is very helpful. Patient and grand daughter aware he is to have UA done tomorrow after radiation treatment. Notified Dr Valere Dross.

## 2014-11-29 ENCOUNTER — Encounter: Payer: Self-pay | Admitting: Radiation Oncology

## 2014-11-29 ENCOUNTER — Telehealth: Payer: Self-pay | Admitting: *Deleted

## 2014-11-29 ENCOUNTER — Ambulatory Visit
Admission: RE | Admit: 2014-11-29 | Discharge: 2014-11-29 | Disposition: A | Discharge status: Home / Self Care | Payer: Medicare Other | Source: Ambulatory Visit | Attending: Radiation Oncology | Admitting: Radiation Oncology

## 2014-11-29 DIAGNOSIS — C679 Malignant neoplasm of bladder, unspecified: Secondary | ICD-10-CM | POA: Diagnosis present

## 2014-11-29 DIAGNOSIS — Z510 Encounter for antineoplastic radiation therapy: Secondary | ICD-10-CM | POA: Diagnosis not present

## 2014-11-29 DIAGNOSIS — N390 Urinary tract infection, site not specified: Secondary | ICD-10-CM | POA: Diagnosis not present

## 2014-11-29 DIAGNOSIS — N3000 Acute cystitis without hematuria: Secondary | ICD-10-CM

## 2014-11-29 LAB — URINALYSIS, MICROSCOPIC - CHCC
Glucose: NEGATIVE mg/dL
PH: 5 (ref 4.6–8.0)
Specific Gravity, Urine: 1.03 (ref 1.003–1.035)

## 2014-11-29 NOTE — Progress Notes (Signed)
[facility] End of Treatment Note  Name:Julian Hale  Date: 11/29/2014 KCM:[id number] DOB:[birth year]   Status:outpatient    CC: Wyatt Haste, MD  Dr. Festus Aloe  REFERRING PHYSICIAN: Dr. 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 Deavers tolerated his treatment reasonably well, but he developed an enterococcus urinary tract infection  which was treated with ampicillin.  He continued to have bladder irritability with frequency/urgency and dysuria for which he resumed his Vesicare in addition to starting Pyridium.  His urinary frequency and urgency improved.  A repeat urine culture is pending at the time this dictation.  Of note is that he had no further hematuria after his first week of radiation therapy.                        PLAN: Routine followup in one month. Patient instructed to call if questions or worsening complaints in interim.

## 2014-11-30 ENCOUNTER — Ambulatory Visit: Payer: Medicare Other

## 2014-11-30 LAB — URINE CULTURE

## 2014-12-04 NOTE — Telephone Encounter (Signed)
error 

## 2014-12-14 ENCOUNTER — Emergency Department (HOSPITAL_COMMUNITY)
Admission: EM | Admit: 2014-12-14 | Discharge: 2014-12-14 | Disposition: A | Discharge status: Home / Self Care | Payer: Medicare Other | Attending: Emergency Medicine | Admitting: Emergency Medicine

## 2014-12-14 ENCOUNTER — Emergency Department (HOSPITAL_COMMUNITY): Payer: Medicare Other

## 2014-12-14 ENCOUNTER — Encounter (HOSPITAL_COMMUNITY): Payer: Self-pay | Admitting: Emergency Medicine

## 2014-12-14 DIAGNOSIS — J449 Chronic obstructive pulmonary disease, unspecified: Secondary | ICD-10-CM | POA: Diagnosis not present

## 2014-12-14 DIAGNOSIS — F410 Panic disorder [episodic paroxysmal anxiety] without agoraphobia: Secondary | ICD-10-CM | POA: Insufficient documentation

## 2014-12-14 DIAGNOSIS — Z9049 Acquired absence of other specified parts of digestive tract: Secondary | ICD-10-CM | POA: Diagnosis not present

## 2014-12-14 DIAGNOSIS — R112 Nausea with vomiting, unspecified: Secondary | ICD-10-CM | POA: Diagnosis not present

## 2014-12-14 DIAGNOSIS — Z79899 Other long term (current) drug therapy: Secondary | ICD-10-CM | POA: Insufficient documentation

## 2014-12-14 DIAGNOSIS — R1084 Generalized abdominal pain: Secondary | ICD-10-CM | POA: Insufficient documentation

## 2014-12-14 DIAGNOSIS — E780 Pure hypercholesterolemia: Secondary | ICD-10-CM | POA: Diagnosis not present

## 2014-12-14 DIAGNOSIS — Z87828 Personal history of other (healed) physical injury and trauma: Secondary | ICD-10-CM | POA: Insufficient documentation

## 2014-12-14 DIAGNOSIS — Z8673 Personal history of transient ischemic attack (TIA), and cerebral infarction without residual deficits: Secondary | ICD-10-CM | POA: Diagnosis not present

## 2014-12-14 DIAGNOSIS — F0390 Unspecified dementia without behavioral disturbance: Secondary | ICD-10-CM | POA: Insufficient documentation

## 2014-12-14 DIAGNOSIS — Z951 Presence of aortocoronary bypass graft: Secondary | ICD-10-CM | POA: Insufficient documentation

## 2014-12-14 DIAGNOSIS — Z9862 Peripheral vascular angioplasty status: Secondary | ICD-10-CM | POA: Insufficient documentation

## 2014-12-14 DIAGNOSIS — Z853 Personal history of malignant neoplasm of breast: Secondary | ICD-10-CM | POA: Diagnosis not present

## 2014-12-14 DIAGNOSIS — Z87891 Personal history of nicotine dependence: Secondary | ICD-10-CM | POA: Diagnosis not present

## 2014-12-14 DIAGNOSIS — Z8551 Personal history of malignant neoplasm of bladder: Secondary | ICD-10-CM | POA: Insufficient documentation

## 2014-12-14 DIAGNOSIS — N400 Enlarged prostate without lower urinary tract symptoms: Secondary | ICD-10-CM | POA: Insufficient documentation

## 2014-12-14 DIAGNOSIS — Z85038 Personal history of other malignant neoplasm of large intestine: Secondary | ICD-10-CM | POA: Insufficient documentation

## 2014-12-14 DIAGNOSIS — Z862 Personal history of diseases of the blood and blood-forming organs and certain disorders involving the immune mechanism: Secondary | ICD-10-CM | POA: Insufficient documentation

## 2014-12-14 DIAGNOSIS — I10 Essential (primary) hypertension: Secondary | ICD-10-CM | POA: Insufficient documentation

## 2014-12-14 DIAGNOSIS — I739 Peripheral vascular disease, unspecified: Secondary | ICD-10-CM | POA: Insufficient documentation

## 2014-12-14 DIAGNOSIS — R109 Unspecified abdominal pain: Secondary | ICD-10-CM | POA: Diagnosis present

## 2014-12-14 DIAGNOSIS — I2510 Atherosclerotic heart disease of native coronary artery without angina pectoris: Secondary | ICD-10-CM | POA: Insufficient documentation

## 2014-12-14 DIAGNOSIS — Z792 Long term (current) use of antibiotics: Secondary | ICD-10-CM | POA: Insufficient documentation

## 2014-12-14 LAB — CBC WITH DIFFERENTIAL/PLATELET
BASOS ABS: 0 10*3/uL (ref 0.0–0.1)
Basophils Relative: 0 % (ref 0–1)
EOS PCT: 4 % (ref 0–5)
Eosinophils Absolute: 0.3 10*3/uL (ref 0.0–0.7)
HCT: 33.8 % — ABNORMAL LOW (ref 39.0–52.0)
Hemoglobin: 10.3 g/dL — ABNORMAL LOW (ref 13.0–17.0)
LYMPHS PCT: 18 % (ref 12–46)
Lymphs Abs: 1.2 10*3/uL (ref 0.7–4.0)
MCH: 26.5 pg (ref 26.0–34.0)
MCHC: 30.5 g/dL (ref 30.0–36.0)
MCV: 86.9 fL (ref 78.0–100.0)
Monocytes Absolute: 0.8 10*3/uL (ref 0.1–1.0)
Monocytes Relative: 12 % (ref 3–12)
NEUTROS ABS: 4.6 10*3/uL (ref 1.7–7.7)
Neutrophils Relative %: 66 % (ref 43–77)
PLATELETS: 209 10*3/uL (ref 150–400)
RBC: 3.89 MIL/uL — ABNORMAL LOW (ref 4.22–5.81)
RDW: 15.5 % (ref 11.5–15.5)
WBC: 6.9 10*3/uL (ref 4.0–10.5)

## 2014-12-14 LAB — COMPREHENSIVE METABOLIC PANEL
ALT: 11 U/L (ref 0–53)
ANION GAP: 9 (ref 5–15)
AST: 24 U/L (ref 0–37)
Albumin: 3.6 g/dL (ref 3.5–5.2)
Alkaline Phosphatase: 121 U/L — ABNORMAL HIGH (ref 39–117)
BUN: 21 mg/dL (ref 6–23)
CHLORIDE: 105 meq/L (ref 96–112)
CO2: 24 mmol/L (ref 19–32)
CREATININE: 1.13 mg/dL (ref 0.50–1.35)
Calcium: 9.2 mg/dL (ref 8.4–10.5)
GFR calc Af Amer: 66 mL/min — ABNORMAL LOW (ref >90)
GFR, EST NON AFRICAN AMERICAN: 57 mL/min — AB (ref >90)
Glucose, Bld: 113 mg/dL — ABNORMAL HIGH (ref 70–99)
Potassium: 4.3 mmol/L (ref 3.5–5.1)
Sodium: 138 mmol/L (ref 135–145)
Total Bilirubin: 0.5 mg/dL (ref 0.3–1.2)
Total Protein: 6.4 g/dL (ref 6.0–8.3)

## 2014-12-14 LAB — I-STAT CG4 LACTIC ACID, ED: Lactic Acid, Venous: 2.44 mmol/L — ABNORMAL HIGH (ref 0.5–2.2)

## 2014-12-14 MED ORDER — LORAZEPAM 2 MG/ML IJ SOLN
1.0000 mg | Freq: Once | INTRAMUSCULAR | Status: AC
  Administered 2014-12-14: 1 mg via INTRAVENOUS
  Filled 2014-12-14: qty 1

## 2014-12-14 MED ORDER — LORAZEPAM 0.5 MG PO TABS: 0.5000 mg | ORAL_TABLET | Freq: Four times a day (QID) | ORAL | Status: AC | PRN

## 2014-12-14 MED ORDER — SENNOSIDES-DOCUSATE SODIUM 8.6-50 MG PO TABS: 2.0000 | ORAL_TABLET | Freq: Every day | ORAL | Status: AC

## 2014-12-14 MED ORDER — ONDANSETRON HCL 4 MG/2ML IJ SOLN
4.0000 mg | Freq: Once | INTRAMUSCULAR | Status: AC
  Administered 2014-12-14: 4 mg via INTRAVENOUS
  Filled 2014-12-14: qty 2

## 2014-12-14 MED ORDER — OXYCODONE HCL 5 MG PO TABS: 5.0000 mg | ORAL_TABLET | ORAL | Status: AC | PRN

## 2014-12-14 MED ORDER — SODIUM CHLORIDE 0.9 % IV BOLUS (SEPSIS)
1000.0000 mL | Freq: Once | INTRAVENOUS | Status: AC
  Administered 2014-12-14: 1000 mL via INTRAVENOUS

## 2014-12-14 MED ORDER — PROMETHAZINE HCL 25 MG/ML IJ SOLN
6.2500 mg | Freq: Once | INTRAMUSCULAR | Status: AC
  Administered 2014-12-14: 6.25 mg via INTRAVENOUS
  Filled 2014-12-14: qty 1

## 2014-12-14 MED ORDER — MORPHINE SULFATE 10 MG/ML IJ SOLN
10.0000 mg | Freq: Once | INTRAMUSCULAR | Status: AC
  Administered 2014-12-14: 10 mg via INTRAVENOUS
  Filled 2014-12-14: qty 1

## 2014-12-14 NOTE — Progress Notes (Signed)
CSW met with patient at bedside. Yolanda Bonine was present. Patient is hard of hearing. Patient states that he presents to the ED because of abdominal pain. Patient says that the pain has gotten worse than when he initially came to the ED.   According to [place], patient lives at home in [place] with his daughter. Grandson also states that the patient seems to be confused.  Danielle Rankin [phone] Omelia Blackwater [phone] ED CSW 12/14/2014 5:27 PM

## 2014-12-14 NOTE — ED Notes (Signed)
[facility] to called to transport pt home. Per dispatcher transport will be her as soon as they can will continue to monitor.

## 2014-12-14 NOTE — ED Notes (Signed)
Pt out of Ed with ems to take home

## 2014-12-14 NOTE — Discharge Instructions (Signed)
As discussed, your father's evaluation today has not demonstrated acute new pathology.  But, it is important that you monitor his condition carefully, and do not hesitate to return to the ED if he develops new, or concerning changes in his condition.

## 2014-12-14 NOTE — ED Notes (Signed)
Pt has cancer that's metastasis.  Pt c/o abd pain that has gotten worse over the past week.  Pt was changed from oxycodone-acetaminophen to just oxy but still not helping with the pain.

## 2014-12-14 NOTE — ED Provider Notes (Signed)
CSN: [id number]     Arrival date & time 12/14/14  1524 History   First MD Initiated Contact with Patient 12/14/14 1606     Chief Complaint  Patient presents with  . Abdominal Pain     HPI  Patient presents with concern of increasing abdominal pain, nausea, vomiting. Patient has had abdominal pain for a long time, but over the past week has become increasingly severe, with no relief from oxycodone. Patient is diffuse, severe, sore. Nausea has increased as well, with vomiting active. No recent bowel movements. Patient's history of dementia, locates in history of present illness. Level V caveat. The patient's daughter provides the history. Patient has a history of metastatic prostate cancer, and completed planned radiation sessions one week ago. No plans for additional intervention beyond palliative measures.    Past Medical History  Diagnosis Date  . ASHD (arteriosclerotic heart disease)   . COPD (chronic obstructive pulmonary disease)   . GERD (gastroesophageal reflux disease)   . BPH (benign prostatic hyperplasia)   . Stroke 01/1999    "mild" (11/16/2012)  . Hypertension   . Hypercholesteremia   . Exertional dyspnea   . S/P angioplasty with stent, to high grade Lt external iliac atery  11/16/12 11/17/2012  . Hematoma of groin, at cath site 11/17/2012  . Acute blood loss anemia, lt groin hematoma 11/17/2012  . CAD (coronary artery disease)   . S/P CABG (coronary artery bypass graft) 2006  . PAD (peripheral artery disease)   . Colon cancer   . Bladder cancer 09/2013    TURBT, trigone of bladder  . Renal lesion     left   Past Surgical History  Procedure Laterality Date  . Cholecystectomy open    . Iliac artery stent  11/16/2012    95% focal stenosis in L external iliac artery, stented w/ a 87mmx3cm Cordis Smart Nitinol self-expanding stent, resulting in reduction of a 95% stenosis to 0% residual.  . Colectomy  ~ 2006    "partial" (11/16/2012)  . Lea doppler   12/17/2012    Normal  . Cardiovascular stress test  10/15/2012    Lexiscan. Basal to Mid Inferolateral bowel artifact. No Lexiscan EKG changes, non-diagnostic for ischemia. No reversible ischemia.  . Transthoracic echocardiogram  04/12/2013    EF 55-60%, LA moderately dilated,   . Femoral-femoral bypass graft    . Eye surgery      bilateral cataracts  . Hernia repair      left  . Transurethral resection of bladder tumor with gyrus (turbt-gyrus) N/A 11/04/2013    Procedure: TRANSURETHRAL RESECTION OF BLADDER TUMOR WITH GYRUS (TURBT-GYRUS);  Surgeon: Claybon Jabs, MD;  Location: [facility];  Service: Urology;  Laterality: N/A;  . Transurethral resection of bladder tumor N/A 09/24/2014    Procedure: TRANSURETHRAL RESECTION OF BLADDER TUMOR (TURBT) GREATER THAN 5 CM;  Surgeon: Festus Aloe, MD;  Location: [facility];  Service: Urology;  Laterality: N/A;  . Cystoscopy Right 09/24/2014    Procedure: CYSTOSCOPY WITH CLOT EVACUATION, RIGHT RETROGRADE, RIGHT URETERAL STENT PLACEMENT;  Surgeon: Festus Aloe, MD;  Location: [facility];  Service: Urology;  Laterality: Right;  . Upper gi endoscopy  09/08/2014    Esophageal stricture   Family History  Problem Relation Age of Onset  . Stroke Father   . Cancer Sister    History  Substance Use Topics  . Smoking status: Former Smoker -- 2.00 packs/day for 30 years    Types: Cigarettes    Quit date: 07/31/1984  .  Smokeless tobacco: Never Used     Comment: 11/16/2012 "quit smoking cigarettes in the 1970's"  . Alcohol Use: No     Comment: 11/16/2012 "wine cooler 4-5 nights/wk", 10/24/14 does not drink alcohol    Review of Systems  Unable to perform ROS: Dementia      Allergies  Review of patient's allergies indicates no known allergies.  Home Medications   Prior to Admission medications   Medication Sig Start Date End Date Taking? Authorizing Provider  ampicillin (PRINCIPEN) 500 MG capsule Take 1 capsule (500 mg total) by mouth 4 (four) times  daily. 11/22/14   Rexene Edison, MD  finasteride (PROSCAR) 5 MG tablet Take 1 tablet (5 mg total) by mouth daily. 09/27/14   Janece Canterbury, MD  HYDROcodone-acetaminophen (NORCO) 5-325 MG per tablet Take 1 tablet by mouth every 6 (six) hours as needed for moderate pain. 11/20/14   Rexene Edison, MD  HYDROcodone-acetaminophen (NORCO/VICODIN) 5-325 MG per tablet  10/17/14   Historical Provider, MD  omeprazole (PRILOSEC) 20 MG capsule Take 20 mg by mouth daily.    Historical Provider, MD  oxyCODONE-acetaminophen (PERCOCET/ROXICET) 5-325 MG per tablet Take 1 tablet by mouth every 4 (four) hours as needed for severe pain. 11/27/14   Rexene Edison, MD  phenazopyridine (PYRIDIUM) 100 MG tablet Take 100 mg by mouth 3 (three) times daily as needed for pain.    Historical Provider, MD  simvastatin (ZOCOR) 80 MG tablet Take 40 mg by mouth daily.    Historical Provider, MD  solifenacin (VESICARE) 10 MG tablet Take 10 mg by mouth daily.    Historical Provider, MD  terazosin (HYTRIN) 1 MG capsule Take 1 mg by mouth 2 (two) times daily.    Historical Provider, MD   BP 133/57 mmHg  Pulse 98  Temp(Src) 98.3 F (36.8 C) (Oral)  Resp 26  SpO2 100% Physical Exam  Constitutional:  Ill-appearing elderly male  HENT:  Head: Normocephalic and atraumatic.  Eyes: Conjunctivae and EOM are normal.  Cardiovascular: Normal rate and regular rhythm.   Pulmonary/Chest: Effort normal. No stridor. No respiratory distress.  Abdominal: He exhibits no distension.  Minimal TTP, though there is generalized discomfort.   Musculoskeletal: He exhibits no edema.  Neurological: He is alert.  Diffuse atrophy, no asymmetry speech is clear, but repetitive  Skin: Skin is warm and dry.  Psychiatric: He is slowed and withdrawn. Cognition and memory are impaired.  Nursing note and vitals reviewed.   ED Course  Procedures (including critical care time) Labs Review Labs Reviewed  COMPREHENSIVE METABOLIC PANEL - Abnormal;  Notable for the following:    Glucose, Bld 113 (*)    Alkaline Phosphatase 121 (*)    GFR calc non Af Amer 57 (*)    GFR calc Af Amer 66 (*)    All other components within normal limits  CBC WITH DIFFERENTIAL - Abnormal; Notable for the following:    RBC 3.89 (*)    Hemoglobin 10.3 (*)    HCT 33.8 (*)    All other components within normal limits  I-STAT CG4 LACTIC ACID, ED - Abnormal; Notable for the following:    Lactic Acid, Venous 2.44 (*)    All other components within normal limits    Imaging Review Ct Abdomen Pelvis Wo Contrast  12/14/2014   CLINICAL DATA:  Increasing generalized abdominal pain for 1 week, history metastatic colon cancer, bladder cancer, known LEFT renal lesion, nausea, vomiting, question obstruction  EXAM: CT ABDOMEN AND PELVIS WITHOUT CONTRAST  TECHNIQUE: Multidetector CT imaging of the abdomen and pelvis was performed following the standard protocol without IV contrast. Sagittal and coronal MPR images reconstructed from axial data set. IV contrast not administered at request of ordering physician. Suboptimal PO contrast identified.  COMPARISON:  10/16/2014  FINDINGS: Extensive atherosclerotic calcifications with note of a femoral-femoral bypass graft.  Gallbladder surgically absent.  BILATERAL renal cysts.  Additional indeterminate attenuation focus upper LEFT kidney 18 x 11 mm image 20.  Liver, spleen, pancreas, and adrenal glands normal.  No hydronephrosis.  Small to moderate-sized hiatal hernia.  Increased stool in colon.  Small bowel loops normal in caliber without obstruction, dilatation or wall thickening.  Again seen thickening of RIGHT lateralurinary bladder compatible with known tumor.  Bladder masses contiguous with prostate gland, unable to exclude extension.  Previously identified ureteral stent no longer seen.  Prior sigmoid resection.  Stomach unremarkable.  LEFT inguinal hernia containing fat.  No mass, adenopathy, free fluid, or free air.  Bones diffusely  demineralized.  Old fractures RIGHT inferior pubic ramus and superior endplates of L3 and L4 again seen.  IMPRESSION: Multiple renal cystic lesions not significantly changed.  LEFT inguinal hernia containing fat.  Extensive atherosclerotic disease.  Known RIGHT lateral bladder tumor.  No acute intra-abdominal or intrapelvic findings.   Electronically Signed   By: Lavonia Dana M.D.   On: 12/14/2014 19:01     EKG Interpretation None     6:25 PM Patient asleep (following narcotics, phenergan, ativan)  7:28 PM Patient remains asleep. The patient's family and I had a lengthy conversation about all findings, including the absence of bowel obstruction, the presence of continued malignancy, as well as the need for outpatient follow-up, and consideration of hospice enrollment. Family was already planning to discuss hospice care with the patient's oncologist next week, will expedite this process.  MDM  This patient with metastatic breast cancer presents with nausea, vomiting, anxiety, abdominal pain. He is here after fluids, analgesics, benzodiazepine. Evaluation here does not demonstrate acute new pathology, though there is evidence for persistent malignancy. Patient improved substantially, was appropriate for discharge with further evaluation, management as an outpatient.    Carmin Muskrat, MD 12/14/14 [phone]

## 2014-12-18 ENCOUNTER — Telehealth: Payer: Self-pay | Admitting: *Deleted

## 2014-12-18 ENCOUNTER — Encounter: Payer: Self-pay | Admitting: *Deleted

## 2014-12-18 NOTE — Telephone Encounter (Signed)
Pam RN left voicemail  That patient was "admitted today to Hospice.  He has severe pain.  Hospice provider increased to Oxy- IR 10 to 20mg  every 4 hours prn pain.  He has said he wants to be a DNR.  Is it okay for our MD to sign this order?" Called Pam informing her Hospice MD can sign DNR orders.  Will update medication list.

## 2014-12-18 NOTE — Telephone Encounter (Signed)
VERBAL ORDER AND READ BACK TO DR.SHADAD- WILL BE ATTENDING PHYSICIAN AND HOSPICE MAY DO SYMPTOM MANAGEMENT. NOTIFIED [place].

## 2014-12-25 ENCOUNTER — Telehealth: Payer: Self-pay | Admitting: *Deleted

## 2014-12-25 NOTE — Telephone Encounter (Signed)
THIS NOTE WAS ROUTED TO DR.Deputy, DIXIE SMITH.

## 2014-12-28 ENCOUNTER — Telehealth: Payer: Self-pay | Admitting: Family Medicine

## 2014-12-28 NOTE — Telephone Encounter (Signed)
Sympathy card sent 

## 2015-01-02 ENCOUNTER — Ambulatory Visit: Payer: Medicare Other | Admitting: Radiation Oncology

## 2015-01-22 DEATH — deceased
